# Patient Record
Sex: Female | Born: 1940 | Race: White | Hispanic: No | Smoking: Former smoker
Health system: Southern US, Community
[De-identification: ages and names within clinical notes are randomized; demographics above are authoritative.]

## PROBLEM LIST (undated history)

## (undated) DIAGNOSIS — G912 (Idiopathic) normal pressure hydrocephalus: Secondary | ICD-10-CM

## (undated) DIAGNOSIS — R269 Unspecified abnormalities of gait and mobility: Secondary | ICD-10-CM

## (undated) DIAGNOSIS — I639 Cerebral infarction, unspecified: Secondary | ICD-10-CM

## (undated) DIAGNOSIS — E782 Mixed hyperlipidemia: Secondary | ICD-10-CM

## (undated) DIAGNOSIS — I340 Nonrheumatic mitral (valve) insufficiency: Secondary | ICD-10-CM

## (undated) DIAGNOSIS — I10 Essential (primary) hypertension: Secondary | ICD-10-CM

## (undated) DIAGNOSIS — I471 Supraventricular tachycardia, unspecified: Secondary | ICD-10-CM

## (undated) DIAGNOSIS — R001 Bradycardia, unspecified: Secondary | ICD-10-CM

## (undated) DIAGNOSIS — K589 Irritable bowel syndrome without diarrhea: Secondary | ICD-10-CM

## (undated) DIAGNOSIS — K5790 Diverticulosis of intestine, part unspecified, without perforation or abscess without bleeding: Secondary | ICD-10-CM

## (undated) HISTORY — PX: CATARACT EXTRACTION W/ INTRAOCULAR LENS IMPLANT: SHX1309

## (undated) HISTORY — DX: Nonrheumatic mitral (valve) insufficiency: I34.0

## (undated) HISTORY — DX: Mixed hyperlipidemia: E78.2

## (undated) HISTORY — DX: Irritable bowel syndrome, unspecified: K58.9

## (undated) HISTORY — DX: Supraventricular tachycardia, unspecified: I47.10

## (undated) HISTORY — PX: COLONOSCOPY: SHX174

## (undated) HISTORY — DX: Supraventricular tachycardia: I47.1

## (undated) HISTORY — DX: (Idiopathic) normal pressure hydrocephalus: G91.2

## (undated) HISTORY — PX: EYE SURGERY: SHX253

## (undated) HISTORY — DX: Bradycardia, unspecified: R00.1

## (undated) HISTORY — DX: Unspecified abnormalities of gait and mobility: R26.9

## (undated) HISTORY — DX: Diverticulosis of intestine, part unspecified, without perforation or abscess without bleeding: K57.90

## (undated) HISTORY — DX: Essential (primary) hypertension: I10

---
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
## 1998-12-13 ENCOUNTER — Other Ambulatory Visit: Admission: RE | Admit: 1998-12-13 | Discharge: 1998-12-13 | Payer: Self-pay | Admitting: Obstetrics & Gynecology

## 2000-05-11 ENCOUNTER — Other Ambulatory Visit: Admission: RE | Admit: 2000-05-11 | Discharge: 2000-05-11 | Payer: Self-pay | Admitting: Obstetrics & Gynecology

## 2001-07-02 ENCOUNTER — Other Ambulatory Visit: Admission: RE | Admit: 2001-07-02 | Discharge: 2001-07-02 | Payer: Self-pay | Admitting: Obstetrics & Gynecology

## 2002-09-14 ENCOUNTER — Other Ambulatory Visit: Admission: RE | Admit: 2002-09-14 | Discharge: 2002-09-14 | Payer: Self-pay | Admitting: Obstetrics & Gynecology

## 2003-04-01 ENCOUNTER — Inpatient Hospital Stay (HOSPITAL_COMMUNITY): Admission: EM | Admit: 2003-04-01 | Discharge: 2003-04-04 | Payer: Self-pay | Admitting: Orthopaedic Surgery

## 2003-04-01 HISTORY — PX: ORIF HIP FRACTURE: SHX2125

## 2003-04-04 ENCOUNTER — Inpatient Hospital Stay (HOSPITAL_COMMUNITY)
Admission: RE | Admit: 2003-04-04 | Discharge: 2003-04-12 | Payer: Self-pay | Admitting: Physical Medicine & Rehabilitation

## 2003-11-08 ENCOUNTER — Other Ambulatory Visit: Admission: RE | Admit: 2003-11-08 | Discharge: 2003-11-08 | Payer: Self-pay | Admitting: Obstetrics & Gynecology

## 2004-04-25 ENCOUNTER — Ambulatory Visit: Payer: Self-pay | Admitting: Cardiology

## 2007-04-08 ENCOUNTER — Ambulatory Visit (HOSPITAL_COMMUNITY): Admission: RE | Admit: 2007-04-08 | Discharge: 2007-04-08 | Payer: Self-pay | Admitting: Ophthalmology

## 2007-04-29 ENCOUNTER — Ambulatory Visit (HOSPITAL_COMMUNITY): Admission: RE | Admit: 2007-04-29 | Discharge: 2007-04-29 | Payer: Self-pay | Admitting: Ophthalmology

## 2008-06-01 ENCOUNTER — Encounter (INDEPENDENT_AMBULATORY_CARE_PROVIDER_SITE_OTHER): Payer: Self-pay | Admitting: Obstetrics & Gynecology

## 2008-06-02 ENCOUNTER — Ambulatory Visit (HOSPITAL_COMMUNITY): Admission: RE | Admit: 2008-06-02 | Discharge: 2008-06-04 | Payer: Self-pay | Admitting: Obstetrics & Gynecology

## 2008-06-02 HISTORY — PX: ABDOMINAL HYSTERECTOMY: SHX81

## 2009-02-04 LAB — HM COLONOSCOPY

## 2010-08-15 ENCOUNTER — Encounter: Payer: Self-pay | Admitting: Nurse Practitioner

## 2010-08-15 DIAGNOSIS — K5792 Diverticulitis of intestine, part unspecified, without perforation or abscess without bleeding: Secondary | ICD-10-CM

## 2010-08-15 DIAGNOSIS — H8110 Benign paroxysmal vertigo, unspecified ear: Secondary | ICD-10-CM

## 2010-08-15 DIAGNOSIS — E785 Hyperlipidemia, unspecified: Secondary | ICD-10-CM | POA: Insufficient documentation

## 2010-08-15 DIAGNOSIS — E559 Vitamin D deficiency, unspecified: Secondary | ICD-10-CM | POA: Insufficient documentation

## 2010-08-15 DIAGNOSIS — J309 Allergic rhinitis, unspecified: Secondary | ICD-10-CM | POA: Insufficient documentation

## 2010-09-09 LAB — CBC
HCT: 32 % — ABNORMAL LOW (ref 36.0–46.0)
HCT: 41.5 % (ref 36.0–46.0)
Hemoglobin: 11.1 g/dL — ABNORMAL LOW (ref 12.0–15.0)
Hemoglobin: 14.1 g/dL (ref 12.0–15.0)
MCHC: 34 g/dL (ref 30.0–36.0)
MCHC: 34.6 g/dL (ref 30.0–36.0)
MCV: 93.4 fL (ref 78.0–100.0)
MCV: 93.8 fL (ref 78.0–100.0)
Platelets: 240 10*3/uL (ref 150–400)
Platelets: 306 10*3/uL (ref 150–400)
RBC: 3.41 MIL/uL — ABNORMAL LOW (ref 3.87–5.11)
RBC: 4.44 MIL/uL (ref 3.87–5.11)
RDW: 12.6 % (ref 11.5–15.5)
RDW: 12.7 % (ref 11.5–15.5)
WBC: 10.7 10*3/uL — ABNORMAL HIGH (ref 4.0–10.5)
WBC: 6.6 10*3/uL (ref 4.0–10.5)

## 2010-09-09 LAB — COMPREHENSIVE METABOLIC PANEL
ALT: 22 U/L (ref 0–35)
AST: 22 U/L (ref 0–37)
Albumin: 3.3 g/dL — ABNORMAL LOW (ref 3.5–5.2)
Alkaline Phosphatase: 50 U/L (ref 39–117)
BUN: 11 mg/dL (ref 6–23)
CO2: 25 mEq/L (ref 19–32)
Calcium: 9.4 mg/dL (ref 8.4–10.5)
Chloride: 102 mEq/L (ref 96–112)
Creatinine, Ser: 0.75 mg/dL (ref 0.4–1.2)
GFR calc Af Amer: >60 mL/min (ref >60)
GFR calc non Af Amer: >60 mL/min (ref >60)
Glucose, Bld: 87 mg/dL (ref 70–99)
Potassium: 4.1 mEq/L (ref 3.5–5.1)
Sodium: 135 mEq/L (ref 135–145)
Total Bilirubin: 0.5 mg/dL (ref 0.3–1.2)
Total Protein: 6.1 g/dL (ref 6.0–8.3)

## 2010-09-09 LAB — PROTIME-INR
INR: 0.9 (ref 0.00–1.49)
Prothrombin Time: 12.6 seconds (ref 11.6–15.2)

## 2010-09-09 LAB — APTT: aPTT: 25 seconds (ref 24–37)

## 2010-10-08 NOTE — H&P (Signed)
NAMESHIARA, MCGOUGH NO.:  [id number]   MEDICAL RECORD NO.:  [id number]          PATIENT TYPE:  AMB   LOCATION:  SDC                           FACILITY:  [facility]   PHYSICIAN:  Freddy Finner, M.D.   DATE OF BIRTH:  May 10, 1941   DATE OF ADMISSION:  DATE OF DISCHARGE:                              HISTORY & PHYSICAL   DATE OF ADMISSION:  The date of her admission will be June 02, 2008.   ADMISSION DIAGNOSIS:  Atypical complex endometrial hyperplasia.   HISTORY OF THE PRESENT ILLNESS:  The patient is a 70 year old widowed  white female, gravida 3, para 2 who has been followed in my office since  approximately 1990 units; however, certainly the late 1980s.  She has  had a couple episodes years ago of abnormal Pap with biopsy proven mild  cervical  dysplasia.  Recent Pap smear, however, have been normal.  She  did have an episode of postmenopausal bleeding for which she had  hysteroscopy and D&C in early December 2009, which showed atypical  complex endometrial hyperplasia.  Based on this finding and after  careful consultation with the patient she has elected to proceed with a  laparoscopic-assisted vaginal hysterectomy, bilateral salpingo-  oophorectomy.   REVIEW OF SYSTEMS:  The review of systems is basically negative.  No  cardiopulmonary, GI or GU complaints at the present time.   PAST MEDICAL HISTORY:  The patient has no known significant medical  illnesses.   SOCIAL HISTORY:  The patient was a long time smoker, but quit  approximately 10 years ago.   ALLERGIES:  The patient has no known allergies to medications other than  SULFA.   MEDICATIONS:  The patient's only current medications are:  1. Bentyl, which she takes for bowel discomfort.  2. Premarin 0.625 mg per day.  3. Prometrium 200 mg for 12 days out of an every 90-day cycle.   BLOOD TRANSFUSIONS:  The patient has never had a blood transfusion.   PAST MEDICAL HISTORY:  Previous surgical  procedures include:  1. D&C as noted above.  2. The patient has had biopsies of breast nodules in the past on two      occasions, which were negative.   PHYSICAL EXAMINATION:  HEENT:  Head, eyes, ears, nose, and throat are  grossly within normal limits.  VITAL SIGNS:  Blood pressure in the office, 102/60.  NECK:  Thyroid gland is not palpably enlarged.  CHEST:  The chest is clear to auscultation.  HEART:  The heart has a normal sinus rhythm without murmurs, rubs or  gallops.  BREASTS:  The breast exam is considered to be normal.  There are no  palpable masses.  No nipple discharge or skin change.  ABDOMEN:  The abdomen is soft and nontender without appreciable  organomegaly or palpable masses.  PELVIC:  The pelvic examination reveals there are mild atrophic changes,  but the vulva, vagina and cervix are normal.  Bimanual reveals the  uterus to be slightly enlarged.  There are no palpable adnexal masses.  RECTAL:  The rectum is palpably  normal and rectovaginal exam confirms  the above findings.  EXTREMITIES:  The extremities are without cyanosis, clubbing or edema.   ASSESSMENT:  1. Biopsy proven complex endometrial hyperplasia.  2. History of cervical dysplasia.   PLAN:  1. Laparoscopically-assisted vaginal hysterectomy with bilateral      salpingo-oophorectomy.  The patient has reviewed the [facility] brochure      describing the operative procedure including technique, potential      complications including infection, hemorrhage and injury to other      organs, and the risk of deep vein thrombosis.  2. The patient has also be counseled that she will be given IV      antibiotics and compression hose for the lower extremities to      reduce the risk of complications.   The patient is admitted now and prepared to proceed with surgery.      Freddy Finner, M.D.  Electronically Signed     WRN/MEDQ  D:  06/01/2008  T:  06/02/2008  Job:  [id number]

## 2010-10-08 NOTE — Op Note (Signed)
Brandi [name], Brandi [name] NO.:  [id number]   MEDICAL RECORD NO.:  [id number]          PATIENT TYPE:  OIB   LOCATION:  9303                          [facility]ACILITY:  [facility]   PHYSICIAN:  Freddy Finner, M.D.   DATE OF BIRTH:  May 08, 1941   DATE OF PROCEDURE:  06/02/2008  DATE OF DISCHARGE:                               OPERATIVE REPORT   PREOPERATIVE DIAGNOSIS:  1. Complex atypical endometrial hyperplasia.  2. Uterine fibroid.   POSTOPERATIVE DIAGNOSIS:  1. Complex atypical endometrial hyperplasia.  2. Uterine fibroid.   OPERATIVE PROCEDURE:  Laparoscopically-assisted vaginal hysterectomy,  bilateral salpingo-oophorectomy.   SURGEON:  Freddy Finner, M.D.   ASSISTANT:  Stann Mainland. Grewal, M.D.   ESTIMATED INTRAOPERATIVE BLOOD LOSS:  300 mL.   ANESTHESIA:  General endotracheal.   INTRAOPERATIVE COMPLICATIONS:  None.   PRESENT ILLNESS:  Details of present illness are recorded in the  admission note.   DESCRIPTION OF PROCEDURE:  The patient was admitted on the morning of  surgery, given a bolus of Ancef 1 gram IV and placed in PAS hose,  brought to the operating room and there placed under adequate general  endotracheal anesthesia.  Abdomen, perineum and vagina were prepped in  the usual fashion with Betadine scrub followed by Betadine solution.  Bladder was evacuated using sterile technique.  Hulka tenaculum was  attached to the cervix under direct visualization.  Sterile drapes were  applied.   Two small incisions were made, one at the umbilicus, one just above the  symphysis.  The anterior abdominal wall was elevated manually and an 11-  mm bladed disposable trocar was introduced in the umbilicus without  difficulty.  Inspection revealed adequate placement with no evidence of  injury on entry.  Pneumoperitoneum was allowed to accumulate with carbon  dioxide gas.  A 5 mm trocar was placed through a lower incision just  above the symphysis.  Through it  spring-loaded grasping forceps and  later the Najat irrigation system were used.   Scanning inspection of the upper abdomen revealed no apparent  abnormalities.  The appendix was not visualized and is presumed  surgically absent.  The uterus itself was enlarged.  The tubes and  ovaries were normal.  There were no peritoneal lesions in the pelvis.  Using the EnSeal device, the infundibulopelvic ligament on the right was  progressively sealed and divided.  The round ligament was sealed and  divided.  The dissection was continued down the broad ligament to a  level just above the uterine arteries.  The left side was then treated  identically.   Attention was then turned vaginally.  Posterior weighted vaginal  retractor was placed. Jacobs tenaculum was applied to replace the Hulka  tenaculum.  Colpotomy incision was made while tenting the mucosa  posterior to the cervix and the posterior peritoneum entered.  The  banana retractor was then placed.  A scalpel was used to release the  mucosa from the cervix and the bladder advanced off the cervix.  The  LigaSure device was then used to seal and divide the uterosacrals.  This  was done in steps because of the marked lack of descent and the  enlargement of the uterus.  The bladder pillars were sealed and divided  with LigaSure.  Bladder was further advanced off the cervix and lower  uterine segment.  Cardinal ligament pedicles were sealed and divided  using LigaSure as were the uterine artery pedicles.  Anterior peritoneum  was entered.  An additional pedicle was taken above the vessels on each  side with LigaSure.  The uterus was enlarged to the point of marked  difficulty delivering it and for that reason it was cored, which allowed  delivery of the uterus through the vaginal introitus.  Remaining  peritoneal pedicles were sealed and divided with LigaSure.  The angles  of the vagina were anchored to uterosacrals with mattress sutures of 0   Monocryl.  The uterosacrals were plicated and posterior peritoneum  closed with interrupted 0 Monocryl.  Cuff was closed vertically with  figure-of-eights of 0 Monocryl.  Foley catheter was placed.   The patient had been given methylene blue or indigo carmine IV to  identify bladder injury and none was apparent.  Reinspection  laparoscopically was carried out using the Najat system for irrigation  and using EnSeal to seal small bleeding sources which were identified.  After completing the hemostasis the irrigating solution was aspirated  from the abdomen.  Gas was allowed to escape.  Instruments were removed.  Skin incisions were closed with interrupted subcuticular sutures of 3-0  Dexon.  0.25% Marcaine was injected for postoperative analgesia in each  incision.  Steri-Strips were applied to the lower incision.  The  procedure was terminated.  The patient was awakened and taken to  recovery room in good condition.      Freddy Finner, M.D.  Electronically Signed     WRN/MEDQ  D:  06/02/2008  T:  06/02/2008  Job:  [id number]

## 2010-10-11 NOTE — Op Note (Signed)
Brandi [name], Brandi [name]                          ACCOUNT NO.:  [id number]   MEDICAL RECORD NO.:  [id number]                   PATIENT TYPE:  INP   LOCATION:  5010                                 FACILITY:  [facility]   PHYSICIAN:  Sharolyn Douglas, M.D.                     DATE OF BIRTH:  Sep 12, 1940   DATE OF PROCEDURE:  04/01/2003  DATE OF DISCHARGE:                                 OPERATIVE REPORT   PREOPERATIVE DIAGNOSIS:  Right femoral neck fracture   POSTOPERATIVE DIAGNOSIS:  Right femoral neck fracture.   PROCEDURE:  Open reduction and internal fixation right femoral neck  fracture with 3 Synthes 7.3-mm cannulated screws.   SURGEON:  Sharolyn Douglas, M.D.   ASSISTANT:  Verlin Fester, P.A.   ANESTHESIA:  General endotracheal anesthesia.   COMPLICATIONS:  None.   INDICATIONS FOR PROCEDURE:  The patient is a 70 year old female  who was  vacuuming this morning. She slipped and landed directly on the right hip.  She was unable to ambulate. She was taken to [facility]. X-rays  revealed  a femoral neck  fracture. She requested transfer to [facility]. I accepted her transfer and she was admitted directly to the  orthopedic floor. She has no previous  history of fractures, no previous hip  complaints. No fevers or chills, sweats or unexplained weight loss.   PAST MEDICAL HISTORY:  Noncontributory.   MEDICATIONS:  None.   ALLERGIES:  None.   PHYSICAL EXAMINATION:  She is resting comfortably in bed. Her range of  motion of the right hip is painful. She has good pulses distally. She is  able to wiggle her toes. She had a small  bruise about her left wrist. There  was no crepitance.   LABORATORY DATA:  X-rays showed a valgus impacted femoral neck  fracture  with minimal displacement.   PLAN:  I reviewed the situation in detail with the patient and the family. I  discussed  treatment options including  hemiarthroplasty versus pinning.  Because of her relatively young age, we  elected to proceed with pinning. She  understands that the fracture may go on to displace or she may develop post  traumatic arthritis requiring arthroplasty. The risks and benefits were  extensively discussed  with the patient and her family  and she elected to  proceed.   DESCRIPTION OF PROCEDURE:  The patient was properly identified  in the  holding area and taken to the operating room. She underwent  general  endotracheal anesthesia without difficulty. She was carefully  positioned on  the fracture table. The right leg was placed in the traction apparatus. The  leg was gently internally rotated so the patella faced forward. The left leg  was placed in the well leg holder. We brought in fluoroscopy and confirmed  that there had been no significant displacement of the fracture. We then  prepped and draped the leg in the usual sterile fashion using a shower  curtain.   A 4-cm incision was made in the lateral aspect of the thigh just below the  vastus ridge. We dissected through the deep fascia and vastus lateralis. A  guide wire was then placed into the femoral head and the posterior inferior  quadrant. We then used a Gatlin gun device to place 2 more parallel guide  wires.   We then placed the 7.3-mm cannulated screws over the guide wires. We used  two 100-mm screws and a 95-mm screw. We confirmed using biplanar fluoroscopy  that we had not penetrated the joint. We had good purchase of the screws.   The guide wires were removed. The wound was irrigated. Final AP and lateral  images were saved. The deep fascia was closed with an 0 Vicryl and the  subcutaneous layer was closed with 2-0 Vicryl followed  by a running 3-0  subcuticular Vicryl in the skin. Benzoin and Steri-Strips were placed. A  sterile dressing was applied.   The patient was extubated without difficulty and transferred to the recovery  room in stable condition. She was able to move her upper  and lower   extremities.                                               Sharolyn Douglas, M.D.    MC/MEDQ  D:  04/01/2003  T:  04/01/2003  Job:  [id number]

## 2010-10-11 NOTE — Discharge Summary (Signed)
Brandi [name], Brandi [name]                          ACCOUNT NO.:  [id number]   MEDICAL RECORD NO.:  [id number]                   PATIENT TYPE:  INP   LOCATION:  5010                                 FACILITY:  [facility]   PHYSICIAN:  Verlin Fester, P.A.                 DATE OF BIRTH:  February 26, 1941   DATE OF ADMISSION:  04/01/2003  DATE OF DISCHARGE:  04/04/2003                                 DISCHARGE SUMMARY   DISCHARGED TO:  [facility].   ADMITTING DIAGNOSES:  1. Right hip fracture.  2. Post menopausal.   DISCHARGE DIAGNOSES:  1. Status post right hip pinning.  2. Post menopausal.  3. On Coumadin treatment x4 weeks.   CONSULTATIONS:  [facility].   PROCEDURE:  Right hip pining of a femoral neck fracture.  This was done by  Dr. Sharolyn Douglas and assisted by Verlin Fester, P.A.-C.   BRIEF HISTORY:  The patient is a 70 year old female who was at her home  prior to admission, vacuuming, with a fall after getting her feet tangled  up.  She states her feet came out from underneath her.  She __________  .  She had right hip pain and unable to bear weight.  She was transported to  the [facility] and on to [facility] for evaluation and  treatment.  Dr. Noel Gerold was consulted for orthopedic management after she was  found to have a right femoral neck fracture.  Best course of management for  this was surgical intervention with right hip pinning.  The risks and  benefits of this procedure were discussed with the patient by Dr. Noel Gerold.  It  was felt that she achieved understanding and wished to proceed.   LABORATORY DATA:  CBC was within normal limits with the exception of a white  count of 11.2 postoperatively.  CBC was monitored for three days.  She did  remain stable.  Hemoglobin dropped slightly to a low of 12.1 and 34.5  respectively.  She was asymptomatic.  __________  PT and PTT preoperatively.  Postoperatively, on Coumadin, it gradually  decreased.  PTT __________  1.4  prior to being discharged to rehabilitation.  BMET postoperatively was  within normal limits, with the exception of glucose 123, BUN 3.  Calcium was  7.9.  UA preoperatively was negative with the exception of __________  .   X-rays postoperatively showed pin placed across the right hip fracture in  satisfactory position.  Preoperative chest x-ray showed no active  cardiopulmonary problem or disease.  __________  EKG.   HOSPITAL COURSE:  On April 01, 2003, the patient was admitted to the  hospital and __________  taken later that day.  __________  without  intraoperative complications.  Routine orthopedic hip protocol was followed.  She progressed well throughout the hospital stay.  She did not develop any  postoperative complications.  On postoperative day  2, her dressing was  removed.  __________  .  Postoperatively, the patient was started on  Coumadin and this was monitored and dosed per the pharmacy while she was in  the hospital.  Her PT and INR gradually increased prior to discharge to  rehabilitation.  The patient received physical therapy while she was in the  hospital.  She did progress __________  secondary to not having any help at  home.  It was felt that her best course would be a short stay in inpatient  rehabilitation.  Secondary to this, rehabilitation doctors were consulted.  It was felt she was deemed an appropriate candidate.  This was arranged by  discharge planning.   By April 04, 2003, the patient was stable and ready for discharge to  rehabilitation medically, and they did get a bed available.  Therefore, she  was transferred over.   DISCHARGE INSTRUCTIONS:  1. __________  weight bearing.  __________  2. Followup in two weeks after discharge from rehabilitation with Dr. Noel Gerold.     Call for an appointment.   DISCHARGE MEDICATIONS:  1. Coumadin.  2. Percocet p.r.n.  3. Phenergan p.r.n.   DISCHARGE DIET:  A regular  diet.   CONDITION ON DISCHARGE:  Stable and improved.   DISPOSITION:  The patient is being discharged from [facility] to  [facility].                                                Verlin Fester, P.A.    CM/MEDQ  D:  05/03/2003  T:  05/03/2003  Job:  [id number]

## 2010-10-11 NOTE — Discharge Summary (Signed)
NAMETENEIL, SHILLER NO.:  [id number]   MEDICAL RECORD NO.:  [id number]          PATIENT TYPE:  OIB   LOCATION:  9303                          FACILITY[facility]  [facility]   PHYSICIAN:  Freddy Finner, M.D.   DATE OF BIRTH:  [birth year]   DATE OF ADMISSION:  06/02/2008  DATE OF DISCHARGE:  06/04/2008                               DISCHARGE SUMMARY   DISCHARGE DIAGNOSES:  Atypical complex hyperplasia of the endometrium,  uterine adenomyosis, uterine leiomyomas, no evidence of malignancy.   OPERATIVE PROCEDURE:  Laparoscopic-assisted vaginal hysterectomy,  bilateral salpingo-oophorectomy.   INTRAOPERATIVE AND POSTOPERATIVE COMPLICATIONS:  None.   DISPOSITION:  The patient was in satisfactory improved condition.  At  the time of her discharge on the second postoperative day, she was  discharged home to resume all of her regular medications except to  discontinue Prometrium.  She is to take a regular diet.  She is given  Percocet 5/325 to be taken for postoperative pain.  She is to have  progressively increasing activity, but to avoid vaginal entry or heavy  lifting.  Her condition is considered to be good.   Details of present illness, past history, family history, review of  systems, and physical exam recorded in the admission note.  The most  remarkable thing about her exam was a recent histologic diagnosis of  right typical complex endometrial hyperplasia.  Her physical exam was  otherwise considered to be normal with perhaps slight enlargement of the  uterus on bimanual exam.   Laboratory data during this admission included a normal EKG, normal CBC,  normal prothrombin time, and PTT, normal Chem-6 panel.  Postoperative  hemoglobin of 11.1.   HOSPITAL COURSE:  The patient was admitted on the morning of surgery.  She was placed in PAS hose.  She was given a bolus of Ancef.  Preoperatively,  she was brought to the operating room in standard  general anesthesia and the  above-described procedure performed without  any intraoperative complications.  Her postoperative course was entirely  satisfactory.  She was a little slow to have adequate return of bowel  function perhaps due to age.  Throughout her postoperative stay,  however, her vital signs were stable.  She was afebrile by the morning  of the second postoperative day.  The incisions were clean, dry, and  intact.  She was having adequate bowel function at that point and  tolerating regular diet.  She was discharged home with further  disposition as noted above.      Freddy Finner, M.D.  Electronically Signed     WRN/MEDQ  D:  07/01/2008  T:  07/01/2008  Job:  [id number]

## 2010-10-11 NOTE — Discharge Summary (Signed)
Brandi [name], Brandi [name]                          ACCOUNT NO.:  [id number]   MEDICAL RECORD NO.:  [id number]                   PATIENT TYPE:  IPS   LOCATION:  4147                                 FACILITY:  [facility]   PHYSICIAN:  Brandi [name], M.D.             DATE OF BIRTH:  Jun 20, 1940   DATE OF ADMISSION:  04/04/2003  DATE OF DISCHARGE:  04/12/2003                                 DISCHARGE SUMMARY   DISCHARGE DIAGNOSES:  1. Right femoral neck fracture status post open reduction and internal     fixation, April 01, 2003.  2. Pain management.  3. Coumadin for deep venous thrombosis prophylaxis.   HISTORY OF PRESENT ILLNESS:  70 year old female admitted April 01, 2003  after a fall while vacuuming the floor, sustaining a right femoral neck  fracture.  She underwent open reduction and internal fixation on April 01, 2003 by Dr. Noel [name].  Placed on Coumadin for deep venous thrombosis  prophylaxis and touch down weight bearing restrictions.   Hospital course was unremarkable.  No chest pain, no nausea or vomiting.  Moderate assist for her transfers.  Latest chemistries with hemoglobin 12.1.  Admitted for a comprehensive rehabilitation program.   PAST MEDICAL HISTORY:  Negative.   ALLERGIES:  SULFA.   PRIMARY CARE PHYSICIAN:  [facility].   MEDICATIONS PRIOR TO ADMISSION:  Premarin.   SOCIAL HISTORY:  No alcohol or tobacco.  She lives alone in [place],  independent prior to admission and retired.  One level home with step to  entry.  Son can assist on discharge.   HOSPITAL COURSE:  The patient did well on rehabilitation services with  therapies initiated on a b.i.d. basis.  The following issues were followed  during patient's rehab course.  Pertaining to [name] right femoral  neck fracture - surgical site is healing nicely with open reduction and  internal fixation April 01, 2003 per Dr. Noel [name].  She will continue to be  touch down weight bearing at  the precautions of orthopedic services.  Pain  management ongoing with the use of Oxycodone p.r.n. and good results.  She  will continue on Coumadin for deep venous thrombosis prophylaxis with latest  INR 2.1, to be completed by [facility] agency.  There were no bleeding  episodes during her rehab course, no bowel or bladder disturbances.  She was  independent in her room with her functional status needing only very little  assistance for lower body dressing.  Home Health therapies would be  arranged.   LATEST LABS:  Showed an INR of 2.1.  Hemoglobin 12.9, hematocrit 36.9.  Sodium 137, potassium 3.5, BUN 6, creatinine 0.7.   DISCHARGE MEDICATIONS:  1. Coumadin, latest dose of 6 mg to be completed on May 01, 2003.  2. Premarin 0.625 mg daily.  3. Oxycodone p.r.n. pain.   ACTIVITY:  Non-weight bearing right lower extremity per orthopedic services.  Home Health physical and occupational therapy.  Home Health nurse for  prothrombin time on Friday, April 14, 2003 per Home Health agency.  FOLLOW UP  Dr. Noel [name] of orthopedic services, call for appointment.      Brandi [name], P.A.                     Brandi [name], M.D.    DA/MEDQ  D:  04/11/2003  T:  04/11/2003  Job:  [id number]   cc:   Brandi [name], M.D.  [address]  [place]  [place] [address]  Fax: [phone]   [facility]

## 2010-10-11 NOTE — H&P (Signed)
Brandi [name], Brandi [name]                          ACCOUNT NO.:  [id number]   MEDICAL RECORD NO.:  [id number]                   [name] TYPE:  INP   LOCATION:  5010                                 FACILITY:  [facility]   PHYSICIAN:  Sharolyn Douglas, M.D.                     DATE OF BIRTH:  1940-06-25   DATE OF ADMISSION:  04/01/2003  DATE OF DISCHARGE:                                HISTORY & PHYSICAL   CHIEF COMPLAINT:  Right hip pain.   HISTORY OF PRESENT ILLNESS:  Brandi [name] is a 70 year old female who was at  home earlier today vacuuming and took a very hard fall.  Brandi [name] got her feet  tangled up.  Brandi [name] states her feet came out from underneath and Brandi [name] landed  very hard on her right hip.  Brandi [name] had Brandi immediate onset of right hip pain  and inability to bear weight.  Brandi [name] was transported to [facility] and  then transferred here to [facility] for evaluation and treatment.  Dr. Noel Gerold was  consulted for orthopedic management.  Brandi [name] was found to have a right hip  femoral neck fracture, which was impacted.  Brandi best course of management  was deemed to be a right hip pinning.  Brandi risks and benefits of that  procedure were discussed with Brandi [name] by Dr. Noel Gerold.  Brandi [name] indicated  understanding and opted to proceed.   ALLERGIES:  SULFA causes nausea and vomiting.   MEDICATIONS:  1. Premarin 0.625 mg daily.  2. Prometryne 100 mg p.o. daily.   PAST MEDICAL HISTORY:  Healthy.   PAST SURGICAL HISTORY:  None.   SOCIAL HISTORY:  Brandi [name] denies alcohol use.  Denies tobacco use.  Brandi [name]  lives alone.  Brandi [name] was very independent prior to being admitted; a  very active 70 year old lady.   FAMILY HISTORY:  Family medical history of noncontributory.   REVIEW OF SYSTEMS:  CONSTITUTIONAL:  Brandi [name] denies fevers, chills,  sweats or bleeding tendencies.  NEUROLOGIC:  Brandi [name] denies blurry vision.  No  delusions, seizures,  headaches or paralysis.  CARDIOVASCULAR:  Denies chest  pain, angina, claudication  or palpitations.  PULMONARY:  Denies shortness of  breath, productive cough or hemoptysis.  GASTROINTESTINAL:  Denies nausea,  vomiting, constipation, diarrhea, melena, or bloody stools.  GENITOURINARY:  Denies dysuria, hematuria or discharge.  MUSCULOSKELETAL:  As per HPI.   PHYSICAL EXAMINATION:  VITAL SIGNS:  Blood pressure 122/68, respirations are  16 and unlabored, pulse is 80 and regular and temperature 97.6.  GENERAL APPEARANCE:  Brandi [name] is a 70 year old white female who is alert  and oriented, and in no acute distress.  Brandi [name] is well-nourished and well-  groomed.  Brandi [name] appears stated age.  Brandi [name] is pleasant and cooperative to exam.  HEENT:  Head is normocephalic and atraumatic.  Pupils equal, round and  react.  Extraocular movements are intact.  Ears patent.  Pharynx clear.  NECK:  Neck is soft palpation.  No lymphadenopathy, thyromegaly or bruits  appreciated.  CHEST:  Chest is clear to auscultation bilaterally.  No rales, rhonchi,  wheezes or friction rubs.  HEART:  S1 and S2.  Regular rate and rhythm.  No murmurs, gallops or rubs  noted.  GENITOURINARY:  Not prudent and not performed.  GASTROINTESTINAL:  Abdomen is soft to palpation.  Nontender and  nondistended.  No organomegaly noted.  Positive bowel sounds throughout.  NEUROLOGIC EXAMINATION:  Motor function and sensation are grossly intact.  EXTREMITIES:  Pulses are intact and symmetric bilaterally.  SKIN:  Skin is intact without any lesions or rashes.   LABORATORY DATA:  X-ray shows a right hip femoral neck impacted fracture.   ASSESSMENT:  Right hip fracture.   PLAN:  Brandi [name] is going to be taken to Brandi operating room for a right  hip pinning.  This is going to be done by Dr. Sharolyn Douglas.      Verlin Fester, P.A.                       Sharolyn Douglas, M.D.    CM/MEDQ  D:  04/01/2003  T:  04/01/2003  Job:  [id number]

## 2011-02-03 LAB — HM COLONOSCOPY

## 2011-03-04 LAB — BASIC METABOLIC PANEL
BUN: 10
CO2: 26
Calcium: 9.3
Chloride: 105
Creatinine, Ser: 0.78
GFR calc Af Amer: >60
GFR calc non Af Amer: >60
Glucose, Bld: 109 — ABNORMAL HIGH
Potassium: 4.2
Sodium: 138

## 2011-03-04 LAB — HEMOGLOBIN AND HEMATOCRIT, BLOOD
HCT: 40.4
Hemoglobin: 13.6

## 2011-06-09 DIAGNOSIS — Z130 Encounter for screening for diseases of the blood and blood-forming organs and certain disorders involving the immune mechanism: Secondary | ICD-10-CM | POA: Diagnosis not present

## 2011-06-09 DIAGNOSIS — Z1231 Encounter for screening mammogram for malignant neoplasm of breast: Secondary | ICD-10-CM | POA: Diagnosis not present

## 2011-06-09 DIAGNOSIS — Z124 Encounter for screening for malignant neoplasm of cervix: Secondary | ICD-10-CM | POA: Diagnosis not present

## 2011-06-17 DIAGNOSIS — E785 Hyperlipidemia, unspecified: Secondary | ICD-10-CM | POA: Diagnosis not present

## 2011-06-26 DIAGNOSIS — R030 Elevated blood-pressure reading, without diagnosis of hypertension: Secondary | ICD-10-CM | POA: Diagnosis not present

## 2011-06-26 DIAGNOSIS — E559 Vitamin D deficiency, unspecified: Secondary | ICD-10-CM | POA: Diagnosis not present

## 2011-06-26 DIAGNOSIS — E785 Hyperlipidemia, unspecified: Secondary | ICD-10-CM | POA: Diagnosis not present

## 2011-07-07 DIAGNOSIS — I10 Essential (primary) hypertension: Secondary | ICD-10-CM | POA: Diagnosis not present

## 2011-07-07 DIAGNOSIS — E782 Mixed hyperlipidemia: Secondary | ICD-10-CM | POA: Diagnosis not present

## 2011-09-16 DIAGNOSIS — R5383 Other fatigue: Secondary | ICD-10-CM | POA: Diagnosis not present

## 2011-09-16 DIAGNOSIS — E785 Hyperlipidemia, unspecified: Secondary | ICD-10-CM | POA: Diagnosis not present

## 2011-09-16 DIAGNOSIS — IMO0001 Reserved for inherently not codable concepts without codable children: Secondary | ICD-10-CM | POA: Diagnosis not present

## 2011-09-16 DIAGNOSIS — E559 Vitamin D deficiency, unspecified: Secondary | ICD-10-CM | POA: Diagnosis not present

## 2011-09-16 DIAGNOSIS — R5381 Other malaise: Secondary | ICD-10-CM | POA: Diagnosis not present

## 2011-10-22 DIAGNOSIS — I10 Essential (primary) hypertension: Secondary | ICD-10-CM | POA: Diagnosis not present

## 2011-10-22 DIAGNOSIS — E782 Mixed hyperlipidemia: Secondary | ICD-10-CM | POA: Diagnosis not present

## 2011-12-15 DIAGNOSIS — E785 Hyperlipidemia, unspecified: Secondary | ICD-10-CM | POA: Diagnosis not present

## 2011-12-15 DIAGNOSIS — E559 Vitamin D deficiency, unspecified: Secondary | ICD-10-CM | POA: Diagnosis not present

## 2011-12-15 DIAGNOSIS — I10 Essential (primary) hypertension: Secondary | ICD-10-CM | POA: Diagnosis not present

## 2012-01-22 DIAGNOSIS — I6789 Other cerebrovascular disease: Secondary | ICD-10-CM | POA: Diagnosis not present

## 2012-01-22 DIAGNOSIS — I471 Supraventricular tachycardia: Secondary | ICD-10-CM | POA: Diagnosis not present

## 2012-01-22 DIAGNOSIS — I5041 Acute combined systolic (congestive) and diastolic (congestive) heart failure: Secondary | ICD-10-CM | POA: Diagnosis not present

## 2012-02-25 DIAGNOSIS — Z23 Encounter for immunization: Secondary | ICD-10-CM | POA: Diagnosis not present

## 2012-02-25 DIAGNOSIS — M25529 Pain in unspecified elbow: Secondary | ICD-10-CM | POA: Diagnosis not present

## 2012-04-08 DIAGNOSIS — E559 Vitamin D deficiency, unspecified: Secondary | ICD-10-CM | POA: Diagnosis not present

## 2012-04-08 DIAGNOSIS — E785 Hyperlipidemia, unspecified: Secondary | ICD-10-CM | POA: Diagnosis not present

## 2012-04-08 DIAGNOSIS — I10 Essential (primary) hypertension: Secondary | ICD-10-CM | POA: Diagnosis not present

## 2012-04-13 DIAGNOSIS — M25529 Pain in unspecified elbow: Secondary | ICD-10-CM | POA: Diagnosis not present

## 2012-04-13 DIAGNOSIS — E785 Hyperlipidemia, unspecified: Secondary | ICD-10-CM | POA: Diagnosis not present

## 2012-04-13 DIAGNOSIS — Z23 Encounter for immunization: Secondary | ICD-10-CM | POA: Diagnosis not present

## 2012-05-10 DIAGNOSIS — I10 Essential (primary) hypertension: Secondary | ICD-10-CM | POA: Diagnosis not present

## 2012-06-14 DIAGNOSIS — M810 Age-related osteoporosis without current pathological fracture: Secondary | ICD-10-CM | POA: Diagnosis not present

## 2012-06-14 DIAGNOSIS — Z01419 Encounter for gynecological examination (general) (routine) without abnormal findings: Secondary | ICD-10-CM | POA: Diagnosis not present

## 2012-06-14 DIAGNOSIS — Z1231 Encounter for screening mammogram for malignant neoplasm of breast: Secondary | ICD-10-CM | POA: Diagnosis not present

## 2012-06-14 DIAGNOSIS — Z130 Encounter for screening for diseases of the blood and blood-forming organs and certain disorders involving the immune mechanism: Secondary | ICD-10-CM | POA: Diagnosis not present

## 2012-06-14 DIAGNOSIS — Z13820 Encounter for screening for osteoporosis: Secondary | ICD-10-CM | POA: Diagnosis not present

## 2012-07-12 DIAGNOSIS — H524 Presbyopia: Secondary | ICD-10-CM | POA: Diagnosis not present

## 2012-07-12 DIAGNOSIS — Z961 Presence of intraocular lens: Secondary | ICD-10-CM | POA: Diagnosis not present

## 2012-07-12 DIAGNOSIS — H52229 Regular astigmatism, unspecified eye: Secondary | ICD-10-CM | POA: Diagnosis not present

## 2012-07-19 DIAGNOSIS — J209 Acute bronchitis, unspecified: Secondary | ICD-10-CM | POA: Diagnosis not present

## 2012-08-03 ENCOUNTER — Encounter: Payer: Self-pay | Admitting: Family Medicine

## 2012-08-12 ENCOUNTER — Ambulatory Visit: Payer: Self-pay | Admitting: Family Medicine

## 2012-08-23 DIAGNOSIS — I10 Essential (primary) hypertension: Secondary | ICD-10-CM | POA: Diagnosis not present

## 2012-08-24 ENCOUNTER — Encounter: Payer: Self-pay | Admitting: Family Medicine

## 2012-08-24 ENCOUNTER — Ambulatory Visit (INDEPENDENT_AMBULATORY_CARE_PROVIDER_SITE_OTHER): Payer: Medicare Other | Admitting: Family Medicine

## 2012-08-24 VITALS — BP 136/67 | HR 58 | Temp 97.0°F | Ht 64.0 in | Wt 163.8 lb

## 2012-08-24 DIAGNOSIS — H8110 Benign paroxysmal vertigo, unspecified ear: Secondary | ICD-10-CM | POA: Diagnosis not present

## 2012-08-24 DIAGNOSIS — D492 Neoplasm of unspecified behavior of bone, soft tissue, and skin: Secondary | ICD-10-CM | POA: Insufficient documentation

## 2012-08-24 DIAGNOSIS — E559 Vitamin D deficiency, unspecified: Secondary | ICD-10-CM

## 2012-08-24 DIAGNOSIS — E785 Hyperlipidemia, unspecified: Secondary | ICD-10-CM | POA: Diagnosis not present

## 2012-08-24 LAB — HEPATIC FUNCTION PANEL
ALT: 19 U/L (ref 0–35)
AST: 20 U/L (ref 0–37)
Albumin: 3.8 g/dL (ref 3.5–5.2)
Alkaline Phosphatase: 62 U/L (ref 39–117)
Bilirubin, Direct: 0.1 mg/dL (ref 0.0–0.3)
Indirect Bilirubin: 0.2 mg/dL (ref 0.0–0.9)
Total Bilirubin: 0.3 mg/dL (ref 0.3–1.2)
Total Protein: 6.1 g/dL (ref 6.0–8.3)

## 2012-08-24 MED ORDER — MECLIZINE HCL 12.5 MG PO TABS: 12.5000 mg | ORAL_TABLET | Freq: Three times a day (TID) | ORAL | Status: DC | PRN

## 2012-08-24 NOTE — Progress Notes (Signed)
Subjective:     Patient ID: Brandi [name], female   DOB: [birth year], 72 y.o.   MRN: [id number]  HPI Patient comes in for routine followup for hyperlipidemia and vitamin D deficiency. A new problem is a 7 mm reddish brown spot on her distal leg and has been present for many months. And wondered if she is in need of seeing a dermatologist Dr. Jorja Loa. She tries to be healthy by eating a healthy diet. With the aging she has noticed that she has an increase in vertigo. This is controlled with the meclizine.    PMH/PSH: reviewed/updated in Epic  SH/FH: reviewed/updated in Epic  Allergies: reviewed/updated in Epic  Medications: reviewed/updated in Epic  Immunizations: reviewed/updated in Epic    Review of Systems    no other symptoms Objective:   Physical Exam     Assessment:       Neoplasm of skin of lower leg - Plan: Ambulatory referral to Dermatology  HLD (hyperlipidemia) - Plan: BASIC METABOLIC PANEL WITH GFR, NMR Lipoprofile with Lipids, Hepatic function panel  Unspecified vitamin D deficiency - Plan: Vitamin D 25 hydroxy  Benign paroxysmal positional vertigo - Plan: BASIC METABOLIC PANEL WITH GFR  .  Plan:     Orders Placed This Encounter  Procedures  . BASIC METABOLIC PANEL WITH GFR  . NMR Lipoprofile with Lipids  . Hepatic function panel  . Vitamin D 25 hydroxy  . Ambulatory referral to Dermatology    Referral Priority:  Routine    Referral Type:  Consultation    Referral Reason:  Specialty Services Required    Requested Specialty:  Dermatology    Number of Visits Requested:  1   No results found for this or any previous visit (from the past 24 hour(s)).                     Meds ordered this encounter  Medications  . ezetimibe (ZETIA) 10 MG tablet    Sig: Take 10 mg by mouth 3 (three) times a week.  . meclizine (ANTIVERT) 12.5 MG tablet    Sig: Take 1 tablet (12.5 mg total) by mouth 3 (three) times daily as needed.    Dispense:  90 tablet    Refill:   0   diet and exercise discussed.  Yohana Bartha P. Modesto Charon, M.D.

## 2012-08-25 LAB — NMR LIPOPROFILE WITH LIPIDS
Cholesterol, Total: 154 mg/dL (ref <200)
HDL Particle Number: 54.6 umol/L (ref >=30.5)
HDL Size: 9.2 nm (ref >=9.2)
HDL-C: 68 mg/dL (ref >=40)
LDL (calc): 60 mg/dL (ref <100)
LDL Particle Number: 1065 nmol/L — ABNORMAL HIGH (ref <1000)
LDL Size: 20.5 nm — ABNORMAL LOW (ref >20.5)
LP-IR Score: 44 (ref <=45)
Large HDL-P: 11.4 umol/L (ref >=4.8)
Large VLDL-P: 3.4 nmol/L — ABNORMAL HIGH (ref <=2.7)
Small LDL Particle Number: 720 nmol/L — ABNORMAL HIGH (ref <=527)
Triglycerides: 131 mg/dL (ref <150)
VLDL Size: 49.1 nm — ABNORMAL HIGH (ref >=46.6)

## 2012-08-25 LAB — VITAMIN D 25 HYDROXY (VIT D DEFICIENCY, FRACTURES): Vit D, 25-Hydroxy: 44 ng/mL (ref 30–89)

## 2012-08-25 LAB — BASIC METABOLIC PANEL WITH GFR
BUN: 10 mg/dL (ref 6–23)
CO2: 22 mEq/L (ref 19–32)
Calcium: 9.5 mg/dL (ref 8.4–10.5)
Chloride: 105 mEq/L (ref 96–112)
Creat: 0.82 mg/dL (ref 0.50–1.10)
GFR, Est African American: 82 mL/min
GFR, Est Non African American: 71 mL/min
Glucose, Bld: 82 mg/dL (ref 70–99)
Potassium: 4.6 mEq/L (ref 3.5–5.3)
Sodium: 138 mEq/L (ref 135–145)

## 2012-08-25 NOTE — Progress Notes (Signed)
Quick Note:  Lab result at goal. No change in Medications for now. ______ 

## 2012-08-26 ENCOUNTER — Encounter: Payer: Self-pay | Admitting: *Deleted

## 2012-10-04 DIAGNOSIS — I471 Supraventricular tachycardia: Secondary | ICD-10-CM | POA: Diagnosis not present

## 2012-10-05 ENCOUNTER — Encounter: Payer: Self-pay | Admitting: Cardiology

## 2012-10-05 DIAGNOSIS — Z780 Asymptomatic menopausal state: Secondary | ICD-10-CM | POA: Diagnosis not present

## 2012-10-05 DIAGNOSIS — Z882 Allergy status to sulfonamides status: Secondary | ICD-10-CM | POA: Diagnosis not present

## 2012-10-05 DIAGNOSIS — Z79899 Other long term (current) drug therapy: Secondary | ICD-10-CM | POA: Diagnosis not present

## 2012-10-05 DIAGNOSIS — R079 Chest pain, unspecified: Secondary | ICD-10-CM | POA: Diagnosis not present

## 2012-10-05 DIAGNOSIS — Z7982 Long term (current) use of aspirin: Secondary | ICD-10-CM | POA: Diagnosis not present

## 2012-10-05 DIAGNOSIS — Z888 Allergy status to other drugs, medicaments and biological substances status: Secondary | ICD-10-CM | POA: Diagnosis not present

## 2012-10-05 DIAGNOSIS — R55 Syncope and collapse: Secondary | ICD-10-CM | POA: Diagnosis not present

## 2012-10-05 DIAGNOSIS — F411 Generalized anxiety disorder: Secondary | ICD-10-CM | POA: Diagnosis not present

## 2012-10-05 DIAGNOSIS — I10 Essential (primary) hypertension: Secondary | ICD-10-CM | POA: Diagnosis not present

## 2012-10-05 DIAGNOSIS — I498 Other specified cardiac arrhythmias: Secondary | ICD-10-CM | POA: Diagnosis not present

## 2012-10-05 DIAGNOSIS — R002 Palpitations: Secondary | ICD-10-CM | POA: Diagnosis not present

## 2012-10-05 DIAGNOSIS — K589 Irritable bowel syndrome without diarrhea: Secondary | ICD-10-CM | POA: Diagnosis not present

## 2012-10-06 DIAGNOSIS — Z780 Asymptomatic menopausal state: Secondary | ICD-10-CM | POA: Diagnosis not present

## 2012-10-06 DIAGNOSIS — I10 Essential (primary) hypertension: Secondary | ICD-10-CM | POA: Diagnosis not present

## 2012-10-06 DIAGNOSIS — R002 Palpitations: Secondary | ICD-10-CM | POA: Diagnosis not present

## 2012-10-06 DIAGNOSIS — I498 Other specified cardiac arrhythmias: Secondary | ICD-10-CM | POA: Diagnosis not present

## 2012-10-06 DIAGNOSIS — R55 Syncope and collapse: Secondary | ICD-10-CM

## 2012-10-15 DIAGNOSIS — R000 Tachycardia, unspecified: Secondary | ICD-10-CM | POA: Diagnosis not present

## 2012-11-21 ENCOUNTER — Encounter: Payer: Self-pay | Admitting: Cardiology

## 2012-11-21 DIAGNOSIS — Z882 Allergy status to sulfonamides status: Secondary | ICD-10-CM | POA: Diagnosis not present

## 2012-11-21 DIAGNOSIS — I472 Ventricular tachycardia: Secondary | ICD-10-CM | POA: Diagnosis not present

## 2012-11-21 DIAGNOSIS — Z780 Asymptomatic menopausal state: Secondary | ICD-10-CM | POA: Diagnosis not present

## 2012-11-21 DIAGNOSIS — Z8249 Family history of ischemic heart disease and other diseases of the circulatory system: Secondary | ICD-10-CM | POA: Diagnosis not present

## 2012-11-21 DIAGNOSIS — Z825 Family history of asthma and other chronic lower respiratory diseases: Secondary | ICD-10-CM | POA: Diagnosis not present

## 2012-11-21 DIAGNOSIS — Z79890 Hormone replacement therapy (postmenopausal): Secondary | ICD-10-CM | POA: Diagnosis not present

## 2012-11-21 DIAGNOSIS — R002 Palpitations: Secondary | ICD-10-CM

## 2012-11-21 DIAGNOSIS — Z79899 Other long term (current) drug therapy: Secondary | ICD-10-CM | POA: Diagnosis not present

## 2012-11-21 DIAGNOSIS — Z883 Allergy status to other anti-infective agents status: Secondary | ICD-10-CM | POA: Diagnosis not present

## 2012-11-21 DIAGNOSIS — Z87891 Personal history of nicotine dependence: Secondary | ICD-10-CM | POA: Diagnosis not present

## 2012-11-21 DIAGNOSIS — Z888 Allergy status to other drugs, medicaments and biological substances status: Secondary | ICD-10-CM | POA: Diagnosis not present

## 2012-11-21 DIAGNOSIS — Z823 Family history of stroke: Secondary | ICD-10-CM | POA: Diagnosis not present

## 2012-11-21 DIAGNOSIS — I10 Essential (primary) hypertension: Secondary | ICD-10-CM | POA: Diagnosis not present

## 2012-11-21 DIAGNOSIS — Z7982 Long term (current) use of aspirin: Secondary | ICD-10-CM | POA: Diagnosis not present

## 2012-11-21 DIAGNOSIS — I498 Other specified cardiac arrhythmias: Secondary | ICD-10-CM | POA: Diagnosis not present

## 2012-11-21 DIAGNOSIS — R5381 Other malaise: Secondary | ICD-10-CM | POA: Diagnosis not present

## 2012-11-22 ENCOUNTER — Other Ambulatory Visit: Payer: Self-pay | Admitting: *Deleted

## 2012-11-22 DIAGNOSIS — Z780 Asymptomatic menopausal state: Secondary | ICD-10-CM | POA: Diagnosis not present

## 2012-11-22 DIAGNOSIS — I10 Essential (primary) hypertension: Secondary | ICD-10-CM | POA: Diagnosis not present

## 2012-11-22 DIAGNOSIS — R002 Palpitations: Secondary | ICD-10-CM

## 2012-11-22 DIAGNOSIS — R5381 Other malaise: Secondary | ICD-10-CM | POA: Diagnosis not present

## 2012-11-22 DIAGNOSIS — I498 Other specified cardiac arrhythmias: Secondary | ICD-10-CM | POA: Diagnosis not present

## 2012-11-22 DIAGNOSIS — I472 Ventricular tachycardia: Secondary | ICD-10-CM | POA: Diagnosis not present

## 2012-11-24 DIAGNOSIS — R002 Palpitations: Secondary | ICD-10-CM | POA: Diagnosis not present

## 2012-12-06 DIAGNOSIS — R000 Tachycardia, unspecified: Secondary | ICD-10-CM | POA: Diagnosis not present

## 2012-12-22 ENCOUNTER — Encounter: Payer: Self-pay | Admitting: Cardiology

## 2012-12-23 ENCOUNTER — Encounter: Payer: Self-pay | Admitting: Cardiology

## 2012-12-23 ENCOUNTER — Ambulatory Visit (INDEPENDENT_AMBULATORY_CARE_PROVIDER_SITE_OTHER): Payer: Medicare Other | Admitting: Cardiology

## 2012-12-23 ENCOUNTER — Telehealth: Payer: Self-pay | Admitting: Cardiology

## 2012-12-23 ENCOUNTER — Encounter: Payer: Self-pay | Admitting: *Deleted

## 2012-12-23 VITALS — BP 189/93 | HR 56 | Ht 64.0 in | Wt 163.0 lb

## 2012-12-23 DIAGNOSIS — I10 Essential (primary) hypertension: Secondary | ICD-10-CM | POA: Diagnosis not present

## 2012-12-23 DIAGNOSIS — R5383 Other fatigue: Secondary | ICD-10-CM

## 2012-12-23 DIAGNOSIS — I493 Ventricular premature depolarization: Secondary | ICD-10-CM | POA: Insufficient documentation

## 2012-12-23 DIAGNOSIS — I471 Supraventricular tachycardia: Secondary | ICD-10-CM | POA: Insufficient documentation

## 2012-12-23 DIAGNOSIS — I4949 Other premature depolarization: Secondary | ICD-10-CM

## 2012-12-23 DIAGNOSIS — R0989 Other specified symptoms and signs involving the circulatory and respiratory systems: Secondary | ICD-10-CM

## 2012-12-23 DIAGNOSIS — E785 Hyperlipidemia, unspecified: Secondary | ICD-10-CM

## 2012-12-23 DIAGNOSIS — I498 Other specified cardiac arrhythmias: Secondary | ICD-10-CM

## 2012-12-23 DIAGNOSIS — R0600 Dyspnea, unspecified: Secondary | ICD-10-CM

## 2012-12-23 DIAGNOSIS — R5381 Other malaise: Secondary | ICD-10-CM | POA: Diagnosis not present

## 2012-12-23 NOTE — Assessment & Plan Note (Signed)
Very brief episode noted on monitoring. Would continue low-dose beta blocker and observation.

## 2012-12-23 NOTE — Assessment & Plan Note (Signed)
Blood pressure is high today. She reports typically "no problems" with her blood pressure. I have asked her to check it at home over the next few weeks and record the results.

## 2012-12-23 NOTE — Progress Notes (Signed)
Clinical Summary Brandi [name] is a 72 y.o.female presenting to the office for a post hospital followup. She was seen in consultation by Dr. Antoine Poche at [facility] in June with palpitations. She is here with her granddaughter. She states that her palpitations have improved somewhat on low-dose Toprol-XL, however she still feels fatigued, has to rest more frequently than usual. She does not endorse any exertional chest pallor. Tries to stay fairly active, but has felt limited over the last year. Legs seem to be quite weak. No claudication symptoms.  Outpatient cardiac monitor demonstrated sinus rhythm with occasional frequent PVCs, also brief bursts of SVT. We reviewed this today. She has had no dizziness or syncope.  Echocardiogram from May of this year demonstrated LVEF 55-60% with grade 2 diastolic dysfunction, mild to moderate left atrial enlargement, atrial septal aneurysm, mild to moderate mitral regurgitation, PASP 35-40 mm mercury.  She has not undergone any ischemic testing.   Allergies  Allergen Reactions  . Sulfa Antibiotics Anaphylaxis  . Sudafed (Pseudoephedrine Hcl)     Current Outpatient Prescriptions  Medication Sig Dispense Refill  . aspirin 81 MG EC tablet Take 81 mg by mouth daily.        . Cholecalciferol (VITAMIN D) 2000 UNITS CAPS Take 2,000 Units by mouth as directed.       . dicyclomine (BENTYL) 10 MG capsule Take 10 mg by mouth 2 (two) times daily.       Marland Kitchen estrogens, conjugated, (PREMARIN) 0.625 MG tablet Take 0.625 mg by mouth daily. Take daily for 21 days then do not take for 7 days.       . fish oil-omega-3 fatty acids 1000 MG capsule Take 1 g by mouth daily.       . meclizine (ANTIVERT) 12.5 MG tablet Take 1 tablet (12.5 mg total) by mouth 3 (three) times daily as needed.  90 tablet  0  . metoprolol succinate (TOPROL-XL) 25 MG 24 hr tablet Take 12.5 mg by mouth daily.      . Multiple Vitamin (MULTIVITAMIN) tablet Take 1 tablet by mouth daily.      . pravastatin  (PRAVACHOL) 40 MG tablet Take 40 mg by mouth daily.       No current facility-administered medications for this visit.    Past Medical History  Diagnosis Date  . Diverticulosis   . Sinus bradycardia   . Irritable bowel syndrome   . Essential hypertension, benign   . Mitral regurgitation     Mild to moderate 09/2012  . Mixed hyperlipidemia     Social History Ms. Seidenberg reports that she quit smoking about 14 years ago. Her smoking use included Cigarettes. She smoked 0.00 packs per day. She does not have any smokeless tobacco history on file. Ms. Dearing reports that she does not drink alcohol.  Review of Systems Negative except as outlined.  Physical Examination Filed Vitals:   12/23/12 1508  BP: 189/93  Pulse: 56   Filed Weights   12/23/12 1508  Weight: 163 lb (73.936 kg)   Appears comfortable at rest. HEENT: Conjunctiva and lids normal, oropharynx clear. Neck: Supple, no elevated JVP or carotid bruits, no thyromegaly. Lungs: Clear to auscultation, nonlabored breathing at rest. Cardiac: Regular rate and rhythm, no S3 or significant systolic murmur, no pericardial rub. Abdomen: Soft, nontender,bowel sounds present. Extremities: No pitting edema, distal pulses 2+. Skin: Warm and dry. Musculoskeletal: No kyphosis. Neuropsychiatric: Alert and oriented x3, affect grossly appropriate.   Problem List and Plan   PVC's (premature ventricular  contractions) Documented by cardiac monitoring. No dizziness or syncope. LVEF is normal. In terms of palpitations, she feels somewhat better on low-dose beta blocker.  SVT (supraventricular tachycardia) Very brief episode noted on monitoring. Would continue low-dose beta blocker and observation.  Fatigue Exertional, associated with feeling of weakness in her legs. LVEF is normal by echocardiogram, no major valvular abnormalities. She has not undergone a formal ischemic workup. We will plan to have her undergo an exercise Cardiolite, hold  beta blocker for the test. Followup arranged to review.  Essential hypertension, benign Blood pressure is high today. She reports typically "no problems" with her blood pressure. I have asked her to check it at home over the next few weeks and record the results.  Hyperlipidemia On Pravachol.    Jonelle Sidle, M.D., F.A.C.C.

## 2012-12-23 NOTE — Telephone Encounter (Signed)
Pt has Medicare and [employer].  No precert required 

## 2012-12-23 NOTE — Assessment & Plan Note (Signed)
Exertional, associated with feeling of weakness in her legs. LVEF is normal by echocardiogram, no major valvular abnormalities. She has not undergone a formal ischemic workup. We will plan to have her undergo an exercise Cardiolite, hold beta blocker for the test. Followup arranged to review.

## 2012-12-23 NOTE — Telephone Encounter (Signed)
exercise stress myoview set for 12-28-12 @ [facility]  Checking percert

## 2012-12-23 NOTE — Patient Instructions (Addendum)
Your physician recommends that you schedule a follow-up appointment in: January 13, 2013 @9 :00 am with Dr. Diona Browner. Your physician recommends that you continue on your current medications as directed. Please refer to the Current Medication list given to you today. Your physician has requested that you have en exercise stress myoview. For further information please visit [email or web address] Please follow instruction sheet, as given.

## 2012-12-23 NOTE — Assessment & Plan Note (Signed)
On Pravachol 

## 2012-12-23 NOTE — Assessment & Plan Note (Signed)
Documented by cardiac monitoring. No dizziness or syncope. LVEF is normal. In terms of palpitations, she feels somewhat better on low-dose beta blocker.

## 2012-12-28 ENCOUNTER — Ambulatory Visit: Payer: Medicare Other | Admitting: Family Medicine

## 2012-12-28 DIAGNOSIS — I4949 Other premature depolarization: Secondary | ICD-10-CM | POA: Diagnosis not present

## 2012-12-28 DIAGNOSIS — I10 Essential (primary) hypertension: Secondary | ICD-10-CM | POA: Diagnosis not present

## 2012-12-28 DIAGNOSIS — R5381 Other malaise: Secondary | ICD-10-CM | POA: Diagnosis not present

## 2012-12-28 DIAGNOSIS — I499 Cardiac arrhythmia, unspecified: Secondary | ICD-10-CM

## 2012-12-28 DIAGNOSIS — I498 Other specified cardiac arrhythmias: Secondary | ICD-10-CM | POA: Diagnosis not present

## 2012-12-28 DIAGNOSIS — R0609 Other forms of dyspnea: Secondary | ICD-10-CM | POA: Diagnosis not present

## 2013-01-04 ENCOUNTER — Telehealth: Payer: Self-pay | Admitting: *Deleted

## 2013-01-04 NOTE — Telephone Encounter (Signed)
Message copied by Eustace Moore on Tue Jan 04, 2013 10:00 AM ------      Message from: Jonelle Sidle      Created: Sun Jan 02, 2013  7:33 AM       Reviewed report.  Low risk test without ischemia.  Looks good overall - please let her know. ------

## 2013-01-04 NOTE — Telephone Encounter (Signed)
Patient informed. 

## 2013-01-10 ENCOUNTER — Other Ambulatory Visit: Payer: Self-pay | Admitting: Dermatology

## 2013-01-10 DIAGNOSIS — L570 Actinic keratosis: Secondary | ICD-10-CM | POA: Diagnosis not present

## 2013-01-10 DIAGNOSIS — D239 Other benign neoplasm of skin, unspecified: Secondary | ICD-10-CM | POA: Diagnosis not present

## 2013-01-10 DIAGNOSIS — L821 Other seborrheic keratosis: Secondary | ICD-10-CM | POA: Diagnosis not present

## 2013-01-10 DIAGNOSIS — D485 Neoplasm of uncertain behavior of skin: Secondary | ICD-10-CM | POA: Diagnosis not present

## 2013-01-13 ENCOUNTER — Ambulatory Visit (INDEPENDENT_AMBULATORY_CARE_PROVIDER_SITE_OTHER): Payer: Medicare Other | Admitting: Cardiology

## 2013-01-13 ENCOUNTER — Ambulatory Visit: Admitting: Cardiology

## 2013-01-13 ENCOUNTER — Encounter: Payer: Self-pay | Admitting: Cardiology

## 2013-01-13 VITALS — BP 145/85 | HR 60 | Ht 64.0 in | Wt 162.0 lb

## 2013-01-13 DIAGNOSIS — R5383 Other fatigue: Secondary | ICD-10-CM

## 2013-01-13 DIAGNOSIS — I471 Supraventricular tachycardia: Secondary | ICD-10-CM

## 2013-01-13 DIAGNOSIS — I498 Other specified cardiac arrhythmias: Secondary | ICD-10-CM | POA: Diagnosis not present

## 2013-01-13 DIAGNOSIS — I493 Ventricular premature depolarization: Secondary | ICD-10-CM

## 2013-01-13 DIAGNOSIS — I4949 Other premature depolarization: Secondary | ICD-10-CM | POA: Diagnosis not present

## 2013-01-13 DIAGNOSIS — R5381 Other malaise: Secondary | ICD-10-CM | POA: Diagnosis not present

## 2013-01-13 DIAGNOSIS — I10 Essential (primary) hypertension: Secondary | ICD-10-CM | POA: Diagnosis not present

## 2013-01-13 MED ORDER — HYDROCHLOROTHIAZIDE 12.5 MG PO CAPS: 12.5000 mg | ORAL_CAPSULE | Freq: Every day | ORAL | Status: AC

## 2013-01-13 MED ORDER — METOPROLOL SUCCINATE ER 25 MG PO TB24: 12.5000 mg | ORAL_TABLET | Freq: Every day | ORAL | Status: DC

## 2013-01-13 NOTE — Assessment & Plan Note (Signed)
No obvious ischemia by recent Myoview and normal LVEF. Continue beta blocker.

## 2013-01-13 NOTE — Assessment & Plan Note (Signed)
Continue current regimen and add HCTZ 12.5 mg daily. Followup BMET in 2 weeks. Continue to check blood pressure at home.

## 2013-01-13 NOTE — Assessment & Plan Note (Signed)
Also feeling of leg weakness and discomfort. She will temporarily come off Pravachol to see if it may be related.

## 2013-01-13 NOTE — Patient Instructions (Addendum)
Your physician recommends that you schedule a follow-up appointment in: 6 weeks. Your physician has recommended you make the following change in your medication: Start hydrochlorothiazide 12.5 mg daily. Your new prescription has been sent to your pharmacy. All other medications will remain the same. Your physician recommends that you return for lab work in: 2 weeks around January 27, 2013 for BMET.

## 2013-01-13 NOTE — Assessment & Plan Note (Signed)
Brief episodes noted by cardiac monitoring. Continue low-dose beta blocker.

## 2013-01-13 NOTE — Progress Notes (Signed)
Clinical Summary Brandi [name] is a 72 y.o.female seen recently in July. She was referred for followup ischemic testing at that time. There were no diagnostic ST segment changes by ECG, soft tissue attenuation noted on perfusion imaging with no ischemia, LVEF 87%.  She presents for followup, we reviewed her stress test results. She was hypertensive with testing, reports home blood pressure checks with systolics in the 150s, occasionally lower. She also continues to report feeling of weakness and fatigue in her legs.  Echocardiogram from May of this year demonstrated LVEF 55-60% with grade 2 diastolic dysfunction, mild to moderate left atrial enlargement, atrial septal aneurysm, mild to moderate mitral regurgitation, PASP 35-40 mm mercury. Cardiac monitor done in July demonstrated sinus rhythm with occasional to frequent PVCs, brief episodes of PSVT, no pauses or atrial fibrillation.   Allergies  Allergen Reactions  . Sulfa Antibiotics Anaphylaxis  . Sudafed [Pseudoephedrine Hcl]     Current Outpatient Prescriptions  Medication Sig Dispense Refill  . aspirin 81 MG EC tablet Take 81 mg by mouth daily.        . Cholecalciferol (VITAMIN D) 2000 UNITS CAPS Take 2,000 Units by mouth as directed.       . dicyclomine (BENTYL) 10 MG capsule Take 10 mg by mouth 2 (two) times daily.       Marland Kitchen estrogens, conjugated, (PREMARIN) 0.625 MG tablet Take 0.625 mg by mouth daily.       . fish oil-omega-3 fatty acids 1000 MG capsule Take 1 g by mouth daily.       . meclizine (ANTIVERT) 12.5 MG tablet Take 1 tablet (12.5 mg total) by mouth 3 (three) times daily as needed.  90 tablet  0  . metoprolol succinate (TOPROL-XL) 25 MG 24 hr tablet Take 0.5 tablets (12.5 mg total) by mouth daily.  45 tablet  3  . Multiple Vitamin (MULTIVITAMIN) tablet Take 1 tablet by mouth daily.      . pravastatin (PRAVACHOL) 40 MG tablet Take 40 mg by mouth daily.      . hydrochlorothiazide (MICROZIDE) 12.5 MG capsule Take 1 capsule  (12.5 mg total) by mouth daily.  90 capsule  3   No current facility-administered medications for this visit.    Past Medical History  Diagnosis Date  . Diverticulosis   . Sinus bradycardia   . Irritable bowel syndrome   . Essential hypertension, benign   . Mitral regurgitation     Mild to moderate 09/2012  . Mixed hyperlipidemia     Social History Brandi [name] reports that she quit smoking about 14 years ago. Her smoking use included Cigarettes. She smoked 0.00 packs per day. She does not have any smokeless tobacco history on file. Brandi [name] reports that she does not drink alcohol.  Review of Systems Symptoms some trouble with finding certain words. No focal motor with weakness. Otherwise negative except as outlined.  Physical Examination Filed Vitals:   01/13/13 0851  BP: 145/85  Pulse: 60   Filed Weights   01/13/13 0851  Weight: 162 lb (73.483 kg)    Appears comfortable at rest.  HEENT: Conjunctiva and lids normal, oropharynx clear.  Neck: Supple, no elevated JVP or carotid bruits, no thyromegaly.  Lungs: Clear to auscultation, nonlabored breathing at rest.  Cardiac: Regular rate and rhythm, no S3 or significant systolic murmur, no pericardial rub.  Abdomen: Soft, nontender,bowel sounds present.  Extremities: No pitting edema, distal pulses 2+.  Skin: Warm and dry.  Musculoskeletal: No kyphosis.  Neuropsychiatric: Alert  and oriented x3, affect grossly appropriate.   Problem List and Plan   Essential hypertension, benign Continue current regimen and add HCTZ 12.5 mg daily. Followup BMET in 2 weeks. Continue to check blood pressure at home.  PVC's (premature ventricular contractions) No obvious ischemia by recent Myoview and normal LVEF. Continue beta blocker.  SVT (supraventricular tachycardia) Brief episodes noted by cardiac monitoring. Continue low-dose beta blocker.  Fatigue Also feeling of leg weakness and discomfort. She will temporarily come off  Pravachol to see if it may be related.    Jonelle Sidle, M.D., F.A.C.C.

## 2013-01-20 ENCOUNTER — Ambulatory Visit (INDEPENDENT_AMBULATORY_CARE_PROVIDER_SITE_OTHER): Payer: Medicare Other | Admitting: Family Medicine

## 2013-01-20 ENCOUNTER — Encounter: Payer: Self-pay | Admitting: Family Medicine

## 2013-01-20 VITALS — BP 147/64 | HR 76 | Temp 97.6°F | Ht 63.0 in | Wt 163.2 lb

## 2013-01-20 DIAGNOSIS — I471 Supraventricular tachycardia, unspecified: Secondary | ICD-10-CM

## 2013-01-20 DIAGNOSIS — I10 Essential (primary) hypertension: Secondary | ICD-10-CM | POA: Diagnosis not present

## 2013-01-20 DIAGNOSIS — R5383 Other fatigue: Secondary | ICD-10-CM

## 2013-01-20 DIAGNOSIS — E559 Vitamin D deficiency, unspecified: Secondary | ICD-10-CM

## 2013-01-20 DIAGNOSIS — I4949 Other premature depolarization: Secondary | ICD-10-CM

## 2013-01-20 DIAGNOSIS — E785 Hyperlipidemia, unspecified: Secondary | ICD-10-CM

## 2013-01-20 DIAGNOSIS — I493 Ventricular premature depolarization: Secondary | ICD-10-CM

## 2013-01-20 DIAGNOSIS — R5381 Other malaise: Secondary | ICD-10-CM

## 2013-01-20 DIAGNOSIS — I498 Other specified cardiac arrhythmias: Secondary | ICD-10-CM

## 2013-01-20 NOTE — Progress Notes (Signed)
Patient ID: Brandi [name], female   DOB: [birth year], 73 y.o.   MRN: [id number] SUBJECTIVE: CC: Chief Complaint  Patient presents with  . Follow-up    4 month follow up chronic problems saw dr Diona Browner  last week . states "been thru the works wore heart mointor treadmill     HPI: Patient is here for follow up of hyperlipidemia: denies Headache;denies Chest Pain;denies weakness;denies Shortness of Breath and orthopnea;denies Visual changes;denies palpitations;denies cough;denies pedal edema;denies symptoms of TIA or stroke;deniesClaudication symptoms. admits to Compliance with medications; denies Problems with medications.  Doing much better since the metoprolol.  Past Medical History  Diagnosis Date  . Diverticulosis   . Sinus bradycardia   . Irritable bowel syndrome   . Essential hypertension, benign   . Mitral regurgitation     Mild to moderate 09/2012  . Mixed hyperlipidemia    Past Surgical History  Procedure Laterality Date  . Abdominal hysterectomy  06-02-2008  . Orif hip fracture     History   Social History  . Marital Status: Widowed    Spouse Name: N/A    Number of Children: N/A  . Years of Education: N/A   Occupational History  . Not on file.   Social History Main Topics  . Smoking status: Former Smoker    Types: Cigarettes    Quit date: 05/26/1998  . Smokeless tobacco: Not on file  . Alcohol Use: No  . Drug Use: No  . Sexual Activity: Not on file   Other Topics Concern  . Not on file   Social History Narrative   Widow   2 children   Family History  Problem Relation Age of Onset  . Stroke Mother   . Asthma Father   . COPD Father   . CAD Sister    Current Outpatient Prescriptions on File Prior to Visit  Medication Sig Dispense Refill  . aspirin 81 MG EC tablet Take 81 mg by mouth daily.        . Cholecalciferol (VITAMIN D) 2000 UNITS CAPS Take 2,000 Units by mouth as directed.       . dicyclomine (BENTYL) 10 MG capsule Take 10 mg by mouth 2  (two) times daily.       Marland Kitchen estrogens, conjugated, (PREMARIN) 0.625 MG tablet Take 0.625 mg by mouth daily.       . fish oil-omega-3 fatty acids 1000 MG capsule Take 1 g by mouth daily.       . hydrochlorothiazide (MICROZIDE) 12.5 MG capsule Take 1 capsule (12.5 mg total) by mouth daily.  90 capsule  3  . meclizine (ANTIVERT) 12.5 MG tablet Take 1 tablet (12.5 mg total) by mouth 3 (three) times daily as needed.  90 tablet  0  . metoprolol succinate (TOPROL-XL) 25 MG 24 hr tablet Take 0.5 tablets (12.5 mg total) by mouth daily.  45 tablet  3  . Multiple Vitamin (MULTIVITAMIN) tablet Take 1 tablet by mouth daily.       No current facility-administered medications on file prior to visit.   Allergies  Allergen Reactions  . Sulfa Antibiotics Anaphylaxis  . Sudafed [Pseudoephedrine Hcl]    Immunization History  Administered Date(s) Administered  . DTaP 05/02/2009  . Influenza Whole 02/19/2010, 02/24/2012  . Pneumococcal Conjugate 04/13/2012  . Tdap 04/25/2009   Prior to Admission medications   Medication Sig Start Date End Date Taking? Authorizing Provider  aspirin 81 MG EC tablet Take 81 mg by mouth daily.     Yes  Historical Provider, MD  Cholecalciferol (VITAMIN D) 2000 UNITS CAPS Take 2,000 Units by mouth as directed.    Yes Historical Provider, MD  dicyclomine (BENTYL) 10 MG capsule Take 10 mg by mouth 2 (two) times daily.    Yes Historical Provider, MD  estrogens, conjugated, (PREMARIN) 0.625 MG tablet Take 0.625 mg by mouth daily.    Yes Historical Provider, MD  fish oil-omega-3 fatty acids 1000 MG capsule Take 1 g by mouth daily.    Yes Historical Provider, MD  hydrochlorothiazide (MICROZIDE) 12.5 MG capsule Take 1 capsule (12.5 mg total) by mouth daily. 01/13/13  Yes Jonelle Sidle, MD  meclizine (ANTIVERT) 12.5 MG tablet Take 1 tablet (12.5 mg total) by mouth 3 (three) times daily as needed. 08/24/12  Yes Ileana Ladd, MD  metoprolol succinate (TOPROL-XL) 25 MG 24 hr tablet Take  0.5 tablets (12.5 mg total) by mouth daily. 01/13/13  Yes Jonelle Sidle, MD  Multiple Vitamin (MULTIVITAMIN) tablet Take 1 tablet by mouth daily.   Yes Historical Provider, MD     ROS: As above in the HPI. All other systems are stable or negative.  OBJECTIVE: APPEARANCE:  Patient in no acute distress.The patient appeared well nourished and normally developed. Acyanotic. Waist: VITAL SIGNS:BP 147/64  Pulse 76  Temp(Src) 97.6 F (36.4 C) (Oral)  Ht 5\' 3"  (1.6 m)  Wt 163 lb 3.2 oz (74.027 kg)  BMI 28.92 kg/m2 WF  SKIN: warm and  Dry without overt rashes, tattoos and scars  HEAD and Neck: without JVD, Head and scalp: normal Eyes:No scleral icterus. Fundi normal, eye movements no change with the abnormality Ears: Auricle normal, canal normal, Tympanic membranes normal, insufflation normal. Nose: normal Throat: normal Neck & thyroid: normal  CHEST & LUNGS: Chest wall: normal Lungs: Clear  CVS: Reveals the PMI to be normally located. Regular rhythm, First and Second Heart sounds are normal,  absence of murmurs, rubs or gallops. Peripheral vasculature: Radial pulses: normal Dorsal pedis pulses: normal Posterior pulses: normal  ABDOMEN:  Appearance: normal Benign, no organomegaly, no masses, no Abdominal Aortic enlargement. No Guarding , no rebound. No Bruits. Bowel sounds: normal  RECTAL: N/A GU: N/A  EXTREMETIES: nonedematous.  MUSCULOSKELETAL:  Spine: normal Joints: intact  NEUROLOGIC: oriented to time,place and person; nonfocal. Strength is normal Sensory is normal Reflexes are normal Cranial Nerves are normal.  Results for orders placed in visit on 08/24/12  BASIC METABOLIC PANEL WITH GFR      Result Value Range   Sodium 138  135 - 145 mEq/L   Potassium 4.6  3.5 - 5.3 mEq/L   Chloride 105  96 - 112 mEq/L   CO2 22  19 - 32 mEq/L   Glucose, Bld 82  70 - 99 mg/dL   BUN 10  6 - 23 mg/dL   Creat 1.61  0.96 - 0.45 mg/dL   Calcium 9.5  8.4 - 40.9  mg/dL   GFR, Est African American 82     GFR, Est Non African American 71    NMR LIPOPROFILE WITH LIPIDS      Result Value Range   LDL Particle Number 1065 (*) <1000 nmol/L   LDL (calc) 60  <100 mg/dL   HDL-C 68  >=81 mg/dL   Triglycerides 191  <478 mg/dL   Cholesterol, Total 295  <200 mg/dL   HDL Particle Number 62.1  >=30.8 umol/L   Large HDL-P 11.4  >=4.8 umol/L   Large VLDL-P 3.4 (*) <=2.7 nmol/L   Small  LDL Particle Number 720 (*) <=527 nmol/L   LDL Size 20.5 (*) >20.5 nm   HDL Size 9.2  >=9.2 nm   VLDL Size 49.1 (*) >=46.6 nm   LP-IR Score 44  <=45  HEPATIC FUNCTION PANEL      Result Value Range   Total Bilirubin 0.3  0.3 - 1.2 mg/dL   Bilirubin, Direct 0.1  0.0 - 0.3 mg/dL   Indirect Bilirubin 0.2  0.0 - 0.9 mg/dL   Alkaline Phosphatase 62  39 - 117 U/L   AST 20  0 - 37 U/L   ALT 19  0 - 35 U/L   Total Protein 6.1  6.0 - 8.3 g/dL   Albumin 3.8  3.5 - 5.2 g/dL  VITAMIN D 25 HYDROXY      Result Value Range   Vit D, 25-Hydroxy 44  30 - 89 ng/mL    ASSESSMENT: HLD (hyperlipidemia) - Plan: NMR, lipoprofile  Essential hypertension, benign  Fatigue  Hyperlipidemia  PVC's (premature ventricular contractions)  SVT (supraventricular tachycardia)  Vitamin D deficiency - Plan: Vit D  25 hydroxy (rtn osteoporosis monitoring)   PLAN:  Orders Placed This Encounter  Procedures  . NMR, lipoprofile    Standing Status: Future     Number of Occurrences:      Standing Expiration Date: 01/20/2014  . Vit D  25 hydroxy (rtn osteoporosis monitoring)    Standing Status: Future     Number of Occurrences:      Standing Expiration Date: 01/20/2014    No change in medications. Healthy diet Keep active and exercise.  Return in about 4 months (around 05/22/2013) for Recheck medical problems.  Valyncia Wiens P. Modesto Charon, M.D.

## 2013-01-27 ENCOUNTER — Ambulatory Visit (INDEPENDENT_AMBULATORY_CARE_PROVIDER_SITE_OTHER): Payer: Medicare Other

## 2013-01-27 ENCOUNTER — Other Ambulatory Visit: Payer: Medicare Other

## 2013-01-27 DIAGNOSIS — Z23 Encounter for immunization: Secondary | ICD-10-CM

## 2013-01-27 DIAGNOSIS — I10 Essential (primary) hypertension: Secondary | ICD-10-CM

## 2013-01-27 NOTE — Progress Notes (Signed)
Patient came in for labs only.

## 2013-01-28 ENCOUNTER — Encounter: Payer: Self-pay | Admitting: *Deleted

## 2013-01-28 LAB — BMP8+EGFR
GFR calc Af Amer: 82 mL/min/{1.73_m2} (ref >59)
GFR calc non Af Amer: 71 mL/min/{1.73_m2} (ref >59)
Glucose: 80 mg/dL (ref 65–99)
Potassium: 4.9 mmol/L (ref 3.5–5.2)
Sodium: 140 mmol/L (ref 134–144)

## 2013-02-07 ENCOUNTER — Encounter: Payer: Self-pay | Admitting: Family Medicine

## 2013-02-07 ENCOUNTER — Ambulatory Visit (INDEPENDENT_AMBULATORY_CARE_PROVIDER_SITE_OTHER): Payer: Medicare Other

## 2013-02-07 ENCOUNTER — Telehealth: Payer: Self-pay | Admitting: Family Medicine

## 2013-02-07 ENCOUNTER — Ambulatory Visit (INDEPENDENT_AMBULATORY_CARE_PROVIDER_SITE_OTHER): Payer: Medicare Other | Admitting: Family Medicine

## 2013-02-07 VITALS — BP 137/68 | HR 72 | Temp 97.3°F | Ht 64.0 in | Wt 162.0 lb

## 2013-02-07 DIAGNOSIS — J209 Acute bronchitis, unspecified: Secondary | ICD-10-CM

## 2013-02-07 DIAGNOSIS — R0989 Other specified symptoms and signs involving the circulatory and respiratory systems: Secondary | ICD-10-CM

## 2013-02-07 MED ORDER — GUAIFENESIN-CODEINE 100-10 MG/5ML PO SYRP: 5.0000 mL | ORAL_SOLUTION | Freq: Three times a day (TID) | ORAL | Status: DC | PRN

## 2013-02-07 MED ORDER — AZITHROMYCIN 250 MG PO TABS: ORAL_TABLET | ORAL | Status: DC

## 2013-02-07 NOTE — Telephone Encounter (Signed)
Sinus congestion, sore throat, and runny nose for 4 days. Has taken Mucinex. Appt scheduled.  Patient aware

## 2013-02-07 NOTE — Progress Notes (Signed)
Patient ID: Brandi [name], female   DOB: 10/21/1940, 72 y.o.   MRN: [id number] SUBJECTIVE: CC: Chief Complaint  Patient presents with  . Cough    sinus infection  cough congestion    HPI: Symptoms started 4 days ago. With a sore throat, then runny nose, color change to phlegm. No blood. Fever : a little 2 days ago. Today the sore throat is better but congested, no SOB,no chest pain, no wheezing.  Past Medical History  Diagnosis Date  . Diverticulosis   . Sinus bradycardia   . Irritable bowel syndrome   . Essential hypertension, benign   . Mitral regurgitation     Mild to moderate 09/2012  . Mixed hyperlipidemia    Past Surgical History  Procedure Laterality Date  . Abdominal hysterectomy  06-02-2008  . Orif hip fracture     History   Social History  . Marital Status: Widowed    Spouse Name: N/A    Number of Children: N/A  . Years of Education: N/A   Occupational History  . Not on file.   Social History Main Topics  . Smoking status: Former Smoker    Types: Cigarettes    Quit date: 05/26/1998  . Smokeless tobacco: Not on file  . Alcohol Use: No  . Drug Use: No  . Sexual Activity: Not on file   Other Topics Concern  . Not on file   Social History Narrative   Widow   2 children   Family History  Problem Relation Age of Onset  . Stroke Mother   . Asthma Father   . COPD Father   . CAD Sister    Current Outpatient Prescriptions on File Prior to Visit  Medication Sig Dispense Refill  . aspirin 81 MG EC tablet Take 81 mg by mouth daily.        . Cholecalciferol (VITAMIN D) 2000 UNITS CAPS Take 2,000 Units by mouth as directed.       . dicyclomine (BENTYL) 10 MG capsule Take 10 mg by mouth 2 (two) times daily.       Marland Kitchen estrogens, conjugated, (PREMARIN) 0.625 MG tablet Take 0.625 mg by mouth daily.       . fish oil-omega-3 fatty acids 1000 MG capsule Take 1 g by mouth daily.       . hydrochlorothiazide (MICROZIDE) 12.5 MG capsule Take 1 capsule (12.5 mg total) by  mouth daily.  90 capsule  3  . meclizine (ANTIVERT) 12.5 MG tablet Take 1 tablet (12.5 mg total) by mouth 3 (three) times daily as needed.  90 tablet  0  . metoprolol succinate (TOPROL-XL) 25 MG 24 hr tablet Take 0.5 tablets (12.5 mg total) by mouth daily.  45 tablet  3  . Multiple Vitamin (MULTIVITAMIN) tablet Take 1 tablet by mouth daily.       No current facility-administered medications on file prior to visit.   Allergies  Allergen Reactions  . Sulfa Antibiotics Anaphylaxis  . Sudafed [Pseudoephedrine Hcl]    Immunization History  Administered Date(s) Administered  . DTaP 05/02/2009  . Influenza Whole 02/19/2010, 02/24/2012  . Pneumococcal Conjugate 04/13/2012  . Tdap 04/25/2009  . Zoster 01/27/2013   Prior to Admission medications   Medication Sig Start Date End Date Taking? Authorizing Provider  aspirin 81 MG EC tablet Take 81 mg by mouth daily.     Yes Historical Provider, MD  Cholecalciferol (VITAMIN D) 2000 UNITS CAPS Take 2,000 Units by mouth as directed.    Yes  Historical Provider, MD  dicyclomine (BENTYL) 10 MG capsule Take 10 mg by mouth 2 (two) times daily.    Yes Historical Provider, MD  estrogens, conjugated, (PREMARIN) 0.625 MG tablet Take 0.625 mg by mouth daily.    Yes Historical Provider, MD  fish oil-omega-3 fatty acids 1000 MG capsule Take 1 g by mouth daily.    Yes Historical Provider, MD  hydrochlorothiazide (MICROZIDE) 12.5 MG capsule Take 1 capsule (12.5 mg total) by mouth daily. 01/13/13  Yes Jonelle Sidle, MD  meclizine (ANTIVERT) 12.5 MG tablet Take 1 tablet (12.5 mg total) by mouth 3 (three) times daily as needed. 08/24/12  Yes Ileana Ladd, MD  metoprolol succinate (TOPROL-XL) 25 MG 24 hr tablet Take 0.5 tablets (12.5 mg total) by mouth daily. 01/13/13  Yes Jonelle Sidle, MD  Multiple Vitamin (MULTIVITAMIN) tablet Take 1 tablet by mouth daily.   Yes Historical Provider, MD  azithromycin (ZITHROMAX Z-PAK) 250 MG tablet 2 tabs on Day 1, then 1 tab on  day 2 to 5. 02/07/13   Ileana Ladd, MD  guaiFENesin-codeine [facility]) 100-10 MG/5ML syrup Take 5 mLs by mouth 3 (three) times daily as needed for cough. 02/07/13   Ileana Ladd, MD     ROS: As above in the HPI. All other systems are stable or negative.  OBJECTIVE: APPEARANCE:  Patient in no acute distress.The patient appeared well nourished and normally developed. Acyanotic. Waist: VITAL SIGNS:BP 137/68  Pulse 72  Temp(Src) 97.3 F (36.3 C) (Oral)  Ht 5\' 4"  (1.626 m)  Wt 162 lb (73.483 kg)  BMI 27.79 kg/m2 WF  SKIN: warm and  Dry without overt rashes, tattoos and scars  HEAD and Neck: without JVD, Head and scalp: normal Eyes:No scleral icterus. prosthesis Ears: Auricle normal, canal normal, Tympanic membranes normal, insufflation normal. Nose: coryza Throat: red Neck & thyroid: normal  CHEST & LUNGS: Chest wall: normal Lungs: Coarse,no rales, no wheezes  CVS: Reveals the PMI to be normally located. Regular rhythm, First and Second Heart sounds are normal,  absence of murmurs, rubs or gallops. Peripheral vasculature: Radial pulses: normal Dorsal pedis pulses: normal Posterior pulses: normal  ABDOMEN:  Appearance: normal Benign, no organomegaly, no masses, no Abdominal Aortic enlargement. No Guarding , no rebound. No Bruits. Bowel sounds: normal  RECTAL: N/A GU: N/A  EXTREMETIES: nonedematous.  MUSCULOSKELETAL:  Spine: normal Joints: intact  NEUROLOGIC: oriented to time,place and person; nonfocal.  ASSESSMENT: Chest congestion - Plan: DG Chest 2 View, azithromycin (ZITHROMAX Z-PAK) 250 MG tablet, guaiFENesin-codeine (ROBITUSSIN AC) 100-10 MG/5ML syrup  Acute bronchitis - Plan: azithromycin (ZITHROMAX Z-PAK) 250 MG tablet, guaiFENesin-codeine (ROBITUSSIN AC) 100-10 MG/5ML syrup  PLAN: Orders Placed This Encounter  Procedures  . DG Chest 2 View    Standing Status: Future     Number of Occurrences: 1     Standing Expiration Date: 04/09/2014     Order Specific Question:  Reason for Exam (SYMPTOM  OR DIAGNOSIS REQUIRED)    Answer:  chest congestion and cough    Order Specific Question:  Preferred imaging location?    Answer:  Internal   Meds ordered this encounter  Medications  . azithromycin (ZITHROMAX Z-PAK) 250 MG tablet    Sig: 2 tabs on Day 1, then 1 tab on day 2 to 5.    Dispense:  6 each    Refill:  0  . guaiFENesin-codeine (ROBITUSSIN AC) 100-10 MG/5ML syrup    Sig: Take 5 mLs by mouth 3 (three) times daily  as needed for cough.    Dispense:  120 mL    Refill:  0    [facility] reading (PRIMARY) by  Dr. Modesto Charon : no infiltrates , no consolidation  Fluids rest.                                 No Follow-up on file. Return for routine appointment.   Karis Emig P. [place]esto Charon, M.D.

## 2013-02-07 NOTE — Telephone Encounter (Signed)
[facility] notified of rx for cough med and pt aware

## 2013-02-25 DIAGNOSIS — I10 Essential (primary) hypertension: Secondary | ICD-10-CM | POA: Diagnosis not present

## 2013-02-25 DIAGNOSIS — Z0000 Encounter for general adult medical examination without abnormal findings: Secondary | ICD-10-CM | POA: Diagnosis not present

## 2013-02-28 ENCOUNTER — Ambulatory Visit (INDEPENDENT_AMBULATORY_CARE_PROVIDER_SITE_OTHER): Payer: Medicare Other | Admitting: Cardiology

## 2013-02-28 ENCOUNTER — Encounter: Payer: Self-pay | Admitting: Cardiology

## 2013-02-28 VITALS — BP 139/81 | HR 71 | Ht 64.0 in | Wt 163.1 lb

## 2013-02-28 DIAGNOSIS — I10 Essential (primary) hypertension: Secondary | ICD-10-CM | POA: Diagnosis not present

## 2013-02-28 DIAGNOSIS — I498 Other specified cardiac arrhythmias: Secondary | ICD-10-CM

## 2013-02-28 DIAGNOSIS — I471 Supraventricular tachycardia: Secondary | ICD-10-CM

## 2013-02-28 NOTE — Patient Instructions (Addendum)

## 2013-02-28 NOTE — Assessment & Plan Note (Signed)
Continue current regimen

## 2013-02-28 NOTE — Assessment & Plan Note (Signed)
No significant palpitations. She continues on low-dose beta blocker for now.

## 2013-02-28 NOTE — Progress Notes (Signed)
    Clinical Summary Brandi [name] is a 72 y.o.female last seen in August for followup of cardiac testing. Medication adjustments were made at that time. Can she doesn't that she is feeling better, fewer palpitations. She brought in home blood pressure checks, generally they look to be in a reasonable range from 110s to 140s. She is tolerating the medication adjustments that we made.  Recent lab work reviewed finding BUN 12, creatinine 0.8, potassium 4.9.   Allergies  Allergen Reactions  . Sulfa Antibiotics Anaphylaxis  . Sudafed [Pseudoephedrine Hcl]     Current Outpatient Prescriptions  Medication Sig Dispense Refill  . aspirin 81 MG EC tablet Take 81 mg by mouth daily.        . Cholecalciferol (VITAMIN D) 2000 UNITS CAPS Take 2,000 Units by mouth daily.       Marland Kitchen dicyclomine (BENTYL) 10 MG capsule Take 10 mg by mouth 2 (two) times daily.       Marland Kitchen estrogens, conjugated, (PREMARIN) 0.625 MG tablet Take 0.625 mg by mouth daily.       . fish oil-omega-3 fatty acids 1000 MG capsule Take 1 g by mouth daily.       . hydrochlorothiazide (MICROZIDE) 12.5 MG capsule Take 1 capsule (12.5 mg total) by mouth daily.  90 capsule  3  . meclizine (ANTIVERT) 12.5 MG tablet Take 1 tablet (12.5 mg total) by mouth 3 (three) times daily as needed.  90 tablet  0  . metoprolol succinate (TOPROL-XL) 25 MG 24 hr tablet Take 0.5 tablets (12.5 mg total) by mouth daily.  45 tablet  3  . Multiple Vitamin (MULTIVITAMIN) tablet Take 1 tablet by mouth daily.       No current facility-administered medications for this visit.    Past Medical History  Diagnosis Date  . Diverticulosis   . Sinus bradycardia   . Irritable bowel syndrome   . Essential hypertension, benign   . Mitral regurgitation     Mild to moderate 09/2012  . Mixed hyperlipidemia     Social History Brandi [name] reports that she quit smoking about 14 years ago. Her smoking use included Cigarettes. She smoked 0.00 packs per day. She does not have any  smokeless tobacco history on file. Brandi [name] reports that she does not drink alcohol.  Review of Systems Negative except as outlined.  Physical Examination Filed Vitals:   02/28/13 1456  BP: 139/81  Pulse: 71   Filed Weights   02/28/13 1456  Weight: 163 lb 1.9 oz (73.991 kg)    Appears comfortable at rest.  HEENT: Conjunctiva and lids normal, oropharynx clear.  Neck: Supple, no elevated JVP or carotid bruits, no thyromegaly.  Lungs: Clear to auscultation, nonlabored breathing at rest.  Cardiac: Regular rate and rhythm, no S3 or significant systolic murmur, no pericardial rub.  Abdomen: Soft, nontender,bowel sounds present.  Extremities: No pitting edema, distal pulses 2+.    Problem List and Plan   Essential hypertension, benign Continue current regimen.  SVT (supraventricular tachycardia) No significant palpitations. She continues on low-dose beta blocker for now.    Jonelle Sidle, M.D., F.A.C.C.

## 2013-04-05 ENCOUNTER — Ambulatory Visit (INDEPENDENT_AMBULATORY_CARE_PROVIDER_SITE_OTHER): Payer: Medicare Other

## 2013-04-05 DIAGNOSIS — Z23 Encounter for immunization: Secondary | ICD-10-CM | POA: Diagnosis not present

## 2013-05-24 ENCOUNTER — Ambulatory Visit: Payer: Medicare Other | Admitting: Family Medicine

## 2013-05-30 DIAGNOSIS — E782 Mixed hyperlipidemia: Secondary | ICD-10-CM | POA: Diagnosis not present

## 2013-05-30 DIAGNOSIS — M549 Dorsalgia, unspecified: Secondary | ICD-10-CM | POA: Diagnosis not present

## 2013-05-30 DIAGNOSIS — I10 Essential (primary) hypertension: Secondary | ICD-10-CM | POA: Diagnosis not present

## 2013-06-10 ENCOUNTER — Encounter: Payer: Self-pay | Admitting: Family Medicine

## 2013-06-10 ENCOUNTER — Ambulatory Visit (INDEPENDENT_AMBULATORY_CARE_PROVIDER_SITE_OTHER): Payer: Medicare Other | Admitting: Family Medicine

## 2013-06-10 VITALS — BP 131/72 | HR 93 | Temp 97.9°F | Ht 64.0 in | Wt 161.8 lb

## 2013-06-10 DIAGNOSIS — I498 Other specified cardiac arrhythmias: Secondary | ICD-10-CM

## 2013-06-10 DIAGNOSIS — I10 Essential (primary) hypertension: Secondary | ICD-10-CM

## 2013-06-10 DIAGNOSIS — E559 Vitamin D deficiency, unspecified: Secondary | ICD-10-CM

## 2013-06-10 DIAGNOSIS — I471 Supraventricular tachycardia, unspecified: Secondary | ICD-10-CM

## 2013-06-10 DIAGNOSIS — E785 Hyperlipidemia, unspecified: Secondary | ICD-10-CM | POA: Diagnosis not present

## 2013-06-10 NOTE — Patient Instructions (Signed)
DASH Diet  The DASH diet stands for "Dietary Approaches to Stop Hypertension." It is a healthy eating plan that has been shown to reduce high blood pressure (hypertension) in as little as 14 days, while also possibly providing other significant health benefits. These other health benefits include reducing the risk of breast cancer after menopause and reducing the risk of type 2 diabetes, heart disease, colon cancer, and stroke. Health benefits also include weight loss and slowing kidney failure in patients with chronic kidney disease.   DIET GUIDELINES  · Limit salt (sodium). Your diet should contain less than 1500 mg of sodium daily.  · Limit refined or processed carbohydrates. Your diet should include mostly whole grains. Desserts and added sugars should be used sparingly.  · Include small amounts of heart-healthy fats. These types of fats include nuts, oils, and tub margarine. Limit saturated and trans fats. These fats have been shown to be harmful in the body.  CHOOSING FOODS   The following food groups are based on a 2000 calorie diet. See your Registered Dietitian for individual calorie needs.  Grains and Grain Products (6 to 8 servings daily)  · Eat More Often: Whole-wheat bread, brown rice, whole-grain or wheat pasta, quinoa, popcorn without added fat or salt (air popped).  · Eat Less Often: White bread, white pasta, white rice, cornbread.  Vegetables (4 to 5 servings daily)  · Eat More Often: Fresh, frozen, and canned vegetables. Vegetables may be raw, steamed, roasted, or grilled with a minimal amount of fat.  · Eat Less Often/Avoid: Creamed or fried vegetables. Vegetables in a cheese sauce.  Fruit (4 to 5 servings daily)  · Eat More Often: All fresh, canned (in natural juice), or frozen fruits. Dried fruits without added sugar. One hundred percent fruit juice (½ cup [237 mL] daily).  · Eat Less Often: Dried fruits with added sugar. Canned fruit in light or heavy syrup.  Lean Meats, Fish, and Poultry (2  servings or less daily. One serving is 3 to 4 oz [85-114 g]).  · Eat More Often: Ninety percent or leaner ground beef, tenderloin, sirloin. Round cuts of beef, chicken breast, turkey breast. All fish. Grill, bake, or broil your meat. Nothing should be fried.  · Eat Less Often/Avoid: Fatty cuts of meat, turkey, or chicken leg, thigh, or wing. Fried cuts of meat or fish.  Dairy (2 to 3 servings)  · Eat More Often: Low-fat or fat-free milk, low-fat plain or light yogurt, reduced-fat or part-skim cheese.  · Eat Less Often/Avoid: Milk (whole, 2%). Whole milk yogurt. Full-fat cheeses.  Nuts, Seeds, and Legumes (4 to 5 servings per week)  · Eat More Often: All without added salt.  · Eat Less Often/Avoid: Salted nuts and seeds, canned beans with added salt.  Fats and Sweets (limited)  · Eat More Often: Vegetable oils, tub margarines without trans fats, sugar-free gelatin. Mayonnaise and salad dressings.  · Eat Less Often/Avoid: Coconut oils, palm oils, butter, stick margarine, cream, half and half, cookies, candy, pie.  FOR MORE INFORMATION  The Dash Diet Eating Plan: www.dashdiet.org  Document Released: 05/01/2011 Document Revised: 08/04/2011 Document Reviewed: 05/01/2011  [employer]® Patient Information ©2014 [employer].

## 2013-06-10 NOTE — Progress Notes (Signed)
Patient ID: Brandi [name], female   DOB: 1941-02-08, 73 y.o.   MRN: [id number] SUBJECTIVE: CC: Chief Complaint  Patient presents with  . Follow-up    4 month follow up chronic problems     HPI: Patient is here for follow up of hyperlipidemia/HTN/SVT: denies Headache;denies Chest Pain;denies weakness;denies Shortness of Breath and orthopnea;denies Visual changes;denies palpitations;denies cough;denies pedal edema;denies symptoms of TIA or stroke;deniesClaudication symptoms. admits to Compliance with medications; denies Problems with medications.  Has been doing well. Recently saw the cardiologists and she is fine.   Past Medical History  Diagnosis Date  . Diverticulosis   . Sinus bradycardia   . Irritable bowel syndrome   . Essential hypertension, benign   . Mitral regurgitation     Mild to moderate 09/2012  . Mixed hyperlipidemia    Past Surgical History  Procedure Laterality Date  . Abdominal hysterectomy  06-02-2008  . Orif hip fracture     History   Social History  . Marital Status: Widowed    Spouse Name: N/A    Number of Children: N/A  . Years of Education: N/A   Occupational History  . Not on file.   Social History Main Topics  . Smoking status: Former Smoker    Types: Cigarettes    Quit date: 05/26/1998  . Smokeless tobacco: Not on file  . Alcohol Use: No  . Drug Use: No  . Sexual Activity: Not on file   Other Topics Concern  . Not on file   Social History Narrative   Widow   2 children   Family History  Problem Relation Age of Onset  . Stroke Mother   . Asthma Father   . COPD Father   . CAD Sister    Current Outpatient Prescriptions on File Prior to Visit  Medication Sig Dispense Refill  . aspirin 81 MG EC tablet Take 81 mg by mouth daily.        . Cholecalciferol (VITAMIN D) 2000 UNITS CAPS Take 2,000 Units by mouth daily.       Marland Kitchen dicyclomine (BENTYL) 10 MG capsule Take 10 mg by mouth 2 (two) times daily.       Marland Kitchen estrogens, conjugated,  (PREMARIN) 0.625 MG tablet Take 0.625 mg by mouth daily.       . fish oil-omega-3 fatty acids 1000 MG capsule Take 1 g by mouth daily.       . hydrochlorothiazide (MICROZIDE) 12.5 MG capsule Take 1 capsule (12.5 mg total) by mouth daily.  90 capsule  3  . meclizine (ANTIVERT) 12.5 MG tablet Take 1 tablet (12.5 mg total) by mouth 3 (three) times daily as needed.  90 tablet  0  . metoprolol succinate (TOPROL-XL) 25 MG 24 hr tablet Take 0.5 tablets (12.5 mg total) by mouth daily.  45 tablet  3  . Multiple Vitamin (MULTIVITAMIN) tablet Take 1 tablet by mouth daily.       No current facility-administered medications on file prior to visit.   Allergies  Allergen Reactions  . Sulfa Antibiotics Anaphylaxis  . Sudafed [Pseudoephedrine Hcl]    Immunization History  Administered Date(s) Administered  . DTaP 05/02/2009  . Influenza Whole 02/19/2010, 02/24/2012  . Influenza,inj,Quad PF,36+ Mos Aug 13, 202014  . Pneumococcal Conjugate-13 04/13/2012  . Tdap 04/25/2009  . Zoster 01/27/2013   Prior to Admission medications   Medication Sig Start Date End Date Taking? Authorizing Provider  aspirin 81 MG EC tablet Take 81 mg by mouth daily.  Historical Provider, MD  Cholecalciferol (VITAMIN D) 2000 UNITS CAPS Take 2,000 Units by mouth daily.     Historical Provider, MD  dicyclomine (BENTYL) 10 MG capsule Take 10 mg by mouth 2 (two) times daily.     Historical Provider, MD  estrogens, conjugated, (PREMARIN) 0.625 MG tablet Take 0.625 mg by mouth daily.     Historical Provider, MD  fish oil-omega-3 fatty acids 1000 MG capsule Take 1 g by mouth daily.     Historical Provider, MD  hydrochlorothiazide (MICROZIDE) 12.5 MG capsule Take 1 capsule (12.5 mg total) by mouth daily. 01/13/13   Satira Sark, MD  meclizine (ANTIVERT) 12.5 MG tablet Take 1 tablet (12.5 mg total) by mouth 3 (three) times daily as needed. 08/24/12   Vernie Shanks, MD  metoprolol succinate (TOPROL-XL) 25 MG 24 hr tablet Take 0.5  tablets (12.5 mg total) by mouth daily. 01/13/13   Satira Sark, MD  Multiple Vitamin (MULTIVITAMIN) tablet Take 1 tablet by mouth daily.    Historical Provider, MD     ROS: As above in the HPI. All other systems are stable or negative.  OBJECTIVE: APPEARANCE:  Patient in no acute distress.The patient appeared well nourished and normally developed. Acyanotic. Waist: VITAL SIGNS:BP 131/72  Pulse 93  Temp(Src) 97.9 F (36.6 C) (Oral)  Ht '5\' 4"'  (1.626 m)  Wt 161 lb 12.8 oz (73.392 kg)  BMI 27.76 kg/m2 WF  SKIN: warm and  Dry without overt rashes, tattoos and scars  HEAD and Neck: without JVD, Head and scalp: normal Eyes:No scleral icterus. Fundi normal, no change . Has a eye prosthesis Ears: Auricle normal, canal normal, Tympanic membranes normal, insufflation normal. Nose: normal Throat: normal Neck & thyroid: normal  CHEST & LUNGS: Chest wall: normal Lungs: Clear  CVS: Reveals the PMI to be normally located. Regular rhythm, First and Second Heart sounds are normal,  absence of murmurs, rubs or gallops. Peripheral vasculature: Radial pulses: normal Dorsal pedis pulses: normal Posterior pulses: normal  ABDOMEN:  Appearance: normal Benign, no organomegaly, no masses, no Abdominal Aortic enlargement. No Guarding , no rebound. No Bruits. Bowel sounds: normal  RECTAL: N/A GU: N/A  EXTREMETIES: nonedematous.  MUSCULOSKELETAL:  Spine: normal Joints: intact  NEUROLOGIC: oriented to time,place and person; nonfocal.  Results for orders placed in visit on 01/27/13  BMP8+EGFR      Result Value Range   Glucose 80  65 - 99 mg/dL   BUN 12  8 - 27 mg/dL   Creatinine, Ser 0.82  0.57 - 1.00 mg/dL   GFR calc non Af Amer 71  >59 mL/min/1.73   GFR calc Af Amer 82  >59 mL/min/1.73   BUN/Creatinine Ratio 15  11 - 26   Sodium 140  134 - 144 mmol/L   Potassium 4.9  3.5 - 5.2 mmol/L   Chloride 101  97 - 108 mmol/L   CO2 24  18 - 29 mmol/L   Calcium 9.3  8.6 - 10.2  mg/dL    ASSESSMENT: Essential hypertension, benign - Plan: CMP14+EGFR  Hyperlipidemia - Plan: CMP14+EGFR, NMR, lipoprofile  SVT (supraventricular tachycardia)  Vitamin D deficiency - Plan: Vit D  25 hydroxy (rtn osteoporosis monitoring)  PLAN:  DASH Diet Same regimen. Continue with her healthy lifestyle.  Orders Placed This Encounter  Procedures  . CMP14+EGFR  . NMR, lipoprofile  . Vit D  25 hydroxy (rtn osteoporosis monitoring)   No orders of the defined types were placed in this encounter.   There  are no discontinued medications. Return in about 3 months (around 09/08/2013) for Recheck medical problems.  Semaj Coburn P. Jacelyn Grip, M.D.

## 2013-06-12 LAB — CMP14+EGFR
ALT: 16 IU/L (ref 0–32)
AST: 20 IU/L (ref 0–40)
Albumin/Globulin Ratio: 1.5 (ref 1.1–2.5)
Albumin: 3.7 g/dL (ref 3.5–4.8)
Alkaline Phosphatase: 64 IU/L (ref 39–117)
BUN/Creatinine Ratio: 14 (ref 11–26)
BUN: 10 mg/dL (ref 8–27)
CO2: 24 mmol/L (ref 18–29)
Calcium: 9.2 mg/dL (ref 8.6–10.2)
Chloride: 99 mmol/L (ref 97–108)
Creatinine, Ser: 0.74 mg/dL (ref 0.57–1.00)
GFR calc Af Amer: 92 mL/min/{1.73_m2} (ref >59)
GFR calc non Af Amer: 80 mL/min/{1.73_m2} (ref >59)
Globulin, Total: 2.4 g/dL (ref 1.5–4.5)
Glucose: 81 mg/dL (ref 65–99)
Potassium: 4.4 mmol/L (ref 3.5–5.2)
Sodium: 141 mmol/L (ref 134–144)
Total Bilirubin: 0.2 mg/dL (ref 0.0–1.2)
Total Protein: 6.1 g/dL (ref 6.0–8.5)

## 2013-06-12 LAB — NMR, LIPOPROFILE
Cholesterol: 203 mg/dL — ABNORMAL HIGH (ref <200)
HDL Cholesterol by NMR: 66 mg/dL (ref >=40)
HDL Particle Number: 49.5 umol/L (ref >=30.5)
LDL Particle Number: 1546 nmol/L — ABNORMAL HIGH (ref <1000)
LDL Size: 20.1 nm — ABNORMAL LOW (ref >20.5)
LDLC SERPL CALC-MCNC: 86 mg/dL (ref <100)
LP-IR Score: 47 — ABNORMAL HIGH (ref <=45)
Small LDL Particle Number: 1114 nmol/L — ABNORMAL HIGH (ref <=527)
Triglycerides by NMR: 255 mg/dL — ABNORMAL HIGH (ref <150)

## 2013-06-12 LAB — VITAMIN D 25 HYDROXY (VIT D DEFICIENCY, FRACTURES): Vit D, 25-Hydroxy: 29.1 ng/mL — ABNORMAL LOW (ref 30.0–100.0)

## 2013-06-27 DIAGNOSIS — Z1231 Encounter for screening mammogram for malignant neoplasm of breast: Secondary | ICD-10-CM | POA: Diagnosis not present

## 2013-06-27 DIAGNOSIS — Z1212 Encounter for screening for malignant neoplasm of rectum: Secondary | ICD-10-CM | POA: Diagnosis not present

## 2013-06-27 DIAGNOSIS — Z124 Encounter for screening for malignant neoplasm of cervix: Secondary | ICD-10-CM | POA: Diagnosis not present

## 2013-08-29 ENCOUNTER — Ambulatory Visit (INDEPENDENT_AMBULATORY_CARE_PROVIDER_SITE_OTHER): Payer: Medicare Other | Admitting: Cardiology

## 2013-08-29 ENCOUNTER — Encounter: Payer: Self-pay | Admitting: Cardiology

## 2013-08-29 VITALS — BP 145/77 | HR 68 | Ht 64.0 in | Wt 163.0 lb

## 2013-08-29 DIAGNOSIS — I10 Essential (primary) hypertension: Secondary | ICD-10-CM

## 2013-08-29 DIAGNOSIS — I498 Other specified cardiac arrhythmias: Secondary | ICD-10-CM

## 2013-08-29 DIAGNOSIS — E785 Hyperlipidemia, unspecified: Secondary | ICD-10-CM | POA: Diagnosis not present

## 2013-08-29 DIAGNOSIS — I471 Supraventricular tachycardia: Secondary | ICD-10-CM

## 2013-08-29 NOTE — Assessment & Plan Note (Signed)
No complaints of palpitations a low-dose beta blocker. Had brief PSVT documented by prior monitoring. No changes made. Continue observation, annual followup for now.

## 2013-08-29 NOTE — Assessment & Plan Note (Signed)
Continues with followup per primary care. She is omega-3 supplements.

## 2013-08-29 NOTE — Progress Notes (Signed)
Clinical Summary Brandi [name] is a 73 y.o.female last seen in October 2014, stable on medical therapy at that point. She continues to do very well, no reported palpitations or chest pain. States active including outdoor chores.  Echocardiogram from May 2014 demonstrated LVEF 55-60% with grade 2 diastolic dysfunction, mild to moderate left atrial enlargement, atrial septal aneurysm, mild to moderate mitral regurgitation, PASP 35-40 mm mercury. Cardiac monitor done in July 2014 demonstrated sinus rhythm with occasional to frequent PVCs, brief episodes of PSVT, no pauses or atrial fibrillation.  Lab work from earlier in the year reviewed finding cholesterol 203, triglycerides 255, HDL 66, LDL 86, particle number as reviewed. Lipids are being managed by Dr. Jacelyn Grip.   Allergies  Allergen Reactions  . Sulfa Antibiotics Anaphylaxis  . Sudafed [Pseudoephedrine Hcl]     Current Outpatient Prescriptions  Medication Sig Dispense Refill  . aspirin 81 MG EC tablet Take 81 mg by mouth daily.        . Cholecalciferol (VITAMIN D) 2000 UNITS CAPS Take 2,000 Units by mouth daily.       Marland Kitchen dicyclomine (BENTYL) 10 MG capsule Take 10 mg by mouth 2 (two) times daily.       Marland Kitchen estrogens, conjugated, (PREMARIN) 0.625 MG tablet Take 0.625 mg by mouth daily.       . fish oil-omega-3 fatty acids 1000 MG capsule Take 1 g by mouth daily.       . hydrochlorothiazide (MICROZIDE) 12.5 MG capsule Take 1 capsule (12.5 mg total) by mouth daily.  90 capsule  3  . meclizine (ANTIVERT) 12.5 MG tablet Take 1 tablet (12.5 mg total) by mouth 3 (three) times daily as needed.  90 tablet  0  . metoprolol succinate (TOPROL-XL) 25 MG 24 hr tablet Take 0.5 tablets (12.5 mg total) by mouth daily.  45 tablet  3  . Multiple Vitamin (MULTIVITAMIN) tablet Take 1 tablet by mouth daily.       No current facility-administered medications for this visit.    Past Medical History  Diagnosis Date  . Diverticulosis   . Sinus bradycardia   .  Irritable bowel syndrome   . Essential hypertension, benign   . Mitral regurgitation     Mild to moderate 09/2012  . Mixed hyperlipidemia   . PSVT (paroxysmal supraventricular tachycardia)     Brief by monitoring    Social History Brandi [name] reports that she quit smoking about 15 years ago. Her smoking use included Cigarettes. She smoked 0.00 packs per day. She does not have any smokeless tobacco history on file. Brandi [name] reports that she does not drink alcohol.  Review of Systems Negative except as outlined.  Physical Examination Filed Vitals:   08/29/13 1109  BP: 145/77  Pulse: 68   Filed Weights   08/29/13 1109  Weight: 163 lb (73.936 kg)    Appears comfortable at rest.  HEENT: Conjunctiva and lids normal, oropharynx clear.  Neck: Supple, no elevated JVP or carotid bruits, no thyromegaly.  Lungs: Clear to auscultation, nonlabored breathing at rest.  Cardiac: Regular rate and rhythm, no S3 or significant systolic murmur, no pericardial rub.  Abdomen: Soft, nontender,bowel sounds present.  Extremities: No pitting edema, distal pulses 2+.    Problem List and Plan   SVT (supraventricular tachycardia) No complaints of palpitations a low-dose beta blocker. Had brief PSVT documented by prior monitoring. No changes made. Continue observation, annual followup for now.  Essential hypertension, benign Reviewed home blood pressure checks, systolics ranging 326Z  to 130s.  Hyperlipidemia Continues with followup per primary care. She is omega-3 supplements.    Brandi [name], M.D., F.A.C.C.

## 2013-08-29 NOTE — Patient Instructions (Signed)
Continue all current medications. Your physician wants you to follow up in:  1 year.  You will receive a reminder letter in the mail one-two months in advance.  If you don't receive a letter, please call our office to schedule the follow up appointment   

## 2013-08-29 NOTE — Assessment & Plan Note (Signed)
Reviewed home blood pressure checks, systolics ranging 022V to 130s.

## 2013-09-02 DIAGNOSIS — J301 Allergic rhinitis due to pollen: Secondary | ICD-10-CM | POA: Diagnosis not present

## 2013-09-08 ENCOUNTER — Encounter: Payer: Self-pay | Admitting: Family Medicine

## 2013-09-08 ENCOUNTER — Ambulatory Visit (INDEPENDENT_AMBULATORY_CARE_PROVIDER_SITE_OTHER): Payer: Medicare Other | Admitting: Family Medicine

## 2013-09-08 VITALS — BP 122/71 | HR 62 | Temp 97.2°F | Ht 64.0 in | Wt 159.2 lb

## 2013-09-08 DIAGNOSIS — I498 Other specified cardiac arrhythmias: Secondary | ICD-10-CM | POA: Diagnosis not present

## 2013-09-08 DIAGNOSIS — M129 Arthropathy, unspecified: Secondary | ICD-10-CM

## 2013-09-08 DIAGNOSIS — M1990 Unspecified osteoarthritis, unspecified site: Secondary | ICD-10-CM

## 2013-09-08 DIAGNOSIS — E785 Hyperlipidemia, unspecified: Secondary | ICD-10-CM | POA: Diagnosis not present

## 2013-09-08 DIAGNOSIS — I10 Essential (primary) hypertension: Secondary | ICD-10-CM

## 2013-09-08 DIAGNOSIS — I471 Supraventricular tachycardia, unspecified: Secondary | ICD-10-CM

## 2013-09-08 DIAGNOSIS — E559 Vitamin D deficiency, unspecified: Secondary | ICD-10-CM | POA: Diagnosis not present

## 2013-09-08 MED ORDER — DICLOFENAC SODIUM 2 % TD SOLN: 1.0000 "application " | Freq: Four times a day (QID) | TRANSDERMAL | Status: DC

## 2013-09-08 MED ORDER — MECLIZINE HCL 12.5 MG PO TABS: 12.5000 mg | ORAL_TABLET | Freq: Three times a day (TID) | ORAL | Status: AC | PRN

## 2013-09-08 NOTE — Progress Notes (Signed)
Patient ID: Brandi [name], female   DOB: [birth year], 73 y.o.   MRN: [id number] SUBJECTIVE: CC: Chief Complaint  Patient presents with  . Follow-up    3 MONTH FOLLOW UP CHRONIC PROBLEMS WANTS RX FOR TENDONSIITIS    HPI: Patient is here for follow up of hyperlipidemia/HTN/SVT: denies Headache;denies Chest Pain;denies weakness;denies Shortness of Breath and orthopnea;denies Visual changes;denies palpitations;denies cough;denies pedal edema;denies symptoms of TIA or stroke;deniesClaudication symptoms. admits to Compliance with medications; denies Problems with medications. As above. Needs the diclofenac solution/Pennsaid refilled. It worked.   Past Medical History  Diagnosis Date  . Diverticulosis   . Sinus bradycardia   . Irritable bowel syndrome   . Essential hypertension, benign   . Mitral regurgitation     Mild to moderate 09/2012  . Mixed hyperlipidemia   . PSVT (paroxysmal supraventricular tachycardia)     Brief by monitoring   Past Surgical History  Procedure Laterality Date  . Abdominal hysterectomy  06-02-2008  . Orif hip fracture     History   Social History  . Marital Status: Widowed    Spouse Name: N/A    Number of Children: N/A  . Years of Education: N/A   Occupational History  . Not on file.   Social History Main Topics  . Smoking status: Former Smoker    Types: Cigarettes    Quit date: 05/26/1998  . Smokeless tobacco: Not on file  . Alcohol Use: No  . Drug Use: No  . Sexual Activity: Not on file   Other Topics Concern  . Not on file   Social History Narrative   Widow   2 children   Family History  Problem Relation Age of Onset  . Stroke Mother   . Asthma Father   . COPD Father   . CAD Sister    Current Outpatient Prescriptions on File Prior to Visit  Medication Sig Dispense Refill  . aspirin 81 MG EC tablet Take 81 mg by mouth daily.        . Cholecalciferol (VITAMIN D) 2000 UNITS CAPS Take 2,000 Units by mouth daily.       Marland Kitchen estrogens,  conjugated, (PREMARIN) 0.625 MG tablet Take 0.625 mg by mouth daily.       . fish oil-omega-3 fatty acids 1000 MG capsule Take 1 g by mouth daily.       . hydrochlorothiazide (MICROZIDE) 12.5 MG capsule Take 1 capsule (12.5 mg total) by mouth daily.  90 capsule  3  . metoprolol succinate (TOPROL-XL) 25 MG 24 hr tablet Take 0.5 tablets (12.5 mg total) by mouth daily.  45 tablet  3  . Multiple Vitamin (MULTIVITAMIN) tablet Take 1 tablet by mouth daily.      Marland Kitchen dicyclomine (BENTYL) 10 MG capsule Take 10 mg by mouth 2 (two) times daily.        No current facility-administered medications on file prior to visit.   Allergies  Allergen Reactions  . Sulfa Antibiotics Anaphylaxis  . Sudafed [Pseudoephedrine Hcl]    Immunization History  Administered Date(s) Administered  . DTaP 05/02/2009  . Influenza Whole 02/19/2010, 02/24/2012  . Influenza,inj,Quad PF,36+ Mos 2020/09/2512  . Pneumococcal Conjugate-13 04/13/2012  . Tdap 04/25/2009  . Zoster 01/27/2013   Prior to Admission medications   Medication Sig Start Date End Date Taking? Authorizing Provider  aspirin 81 MG EC tablet Take 81 mg by mouth daily.     Yes Historical Provider, MD  Cholecalciferol (VITAMIN D) 2000 UNITS CAPS Take 2,000 Units by  mouth daily.    Yes Historical Provider, MD  estrogens, conjugated, (PREMARIN) 0.625 MG tablet Take 0.625 mg by mouth daily.    Yes Historical Provider, MD  fish oil-omega-3 fatty acids 1000 MG capsule Take 1 g by mouth daily.    Yes Historical Provider, MD  hydrochlorothiazide (MICROZIDE) 12.5 MG capsule Take 1 capsule (12.5 mg total) by mouth daily. 01/13/13  Yes Satira Sark, MD  meclizine (ANTIVERT) 12.5 MG tablet Take 1 tablet (12.5 mg total) by mouth 3 (three) times daily as needed. 08/24/12  Yes Vernie Shanks, MD  metoprolol succinate (TOPROL-XL) 25 MG 24 hr tablet Take 0.5 tablets (12.5 mg total) by mouth daily. 01/13/13  Yes Satira Sark, MD  Multiple Vitamin (MULTIVITAMIN) tablet Take 1  tablet by mouth daily.   Yes Historical Provider, MD  dicyclomine (BENTYL) 10 MG capsule Take 10 mg by mouth 2 (two) times daily.     Historical Provider, MD     ROS: As above in the HPI. All other systems are stable or negative.  OBJECTIVE: APPEARANCE:  Patient in no acute distress.The patient appeared well nourished and normally developed. Acyanotic. Waist: VITAL SIGNS:BP 122/71  Pulse 62  Temp(Src) 97.2 F (36.2 C) (Oral)  Ht _0  (1.626 m)  Wt 159 lb 3.2 oz (72.213 kg)  BMI 27.31 kg/m2 WF   SKIN: warm and  Dry without overt rashes, tattoos and scars  HEAD and Neck: without JVD, Head and scalp: normal Eyes:No scleral icterus. No changes from before. Ears: Auricle normal, canal normal, Tympanic membranes normal, insufflation normal. Nose: normal Throat: normal Neck & thyroid: normal  CHEST & LUNGS: Chest wall: normal Lungs: Clear  CVS: Reveals the PMI to be normally located. Regular rhythm, First and Second Heart sounds are normal,  absence of murmurs, rubs or gallops. Peripheral vasculature: Radial pulses: normal Dorsal pedis pulses: normal Posterior pulses: normal  ABDOMEN:  Appearance: normal Benign, no organomegaly, no masses, no Abdominal Aortic enlargement. No Guarding , no rebound. No Bruits. Bowel sounds: normal  RECTAL: N/A GU: N/A  EXTREMETIES: nonedematous.  MUSCULOSKELETAL:  Spine: normal Joints: intact  NEUROLOGIC: oriented to time,place and person; nonfocal. Strength is normal Sensory is normal Reflexes are normal Cranial Nerves are normal.  Results for orders placed in visit on 06/10/13  CMP14+EGFR      Result Value Ref Range   Glucose 81  65 - 99 mg/dL   BUN 10  8 - 27 mg/dL   Creatinine, Ser 0.74  0.57 - 1.00 mg/dL   GFR calc non Af Amer 80  >59 mL/min/1.73   GFR calc Af Amer 92  >59 mL/min/1.73   BUN/Creatinine Ratio 14  11 - 26   Sodium 141  134 - 144 mmol/L   Potassium 4.4  3.5 - 5.2 mmol/L   Chloride 99  97 - 108  mmol/L   CO2 24  18 - 29 mmol/L   Calcium 9.2  8.6 - 10.2 mg/dL   Total Protein 6.1  6.0 - 8.5 g/dL   Albumin 3.7  3.5 - 4.8 g/dL   Globulin, Total 2.4  1.5 - 4.5 g/dL   Albumin/Globulin Ratio 1.5  1.1 - 2.5   Total Bilirubin 0.2  0.0 - 1.2 mg/dL   Alkaline Phosphatase 64  39 - 117 IU/L   AST 20  0 - 40 IU/L   ALT 16  0 - 32 IU/L  NMR, LIPOPROFILE      Result Value Ref Range   LDL  Particle Number [id number] (*) <1000 nmol/L   LDLC SERPL CALC-MCNC 86  <100 mg/dL   HDL Cholesterol by NMR 66  >=40 mg/dL   Triglycerides by NMR 255 (*) <150 mg/dL   Cholesterol 203 (*) <200 mg/dL   HDL Particle Number [id number]  >=30.5 umol/L   Small LDL Particle Number [id number] (*) <=527 nmol/L   LDL Size 20.1 (*) >20.5 nm   LP-IR Score 47 (*) <=45  VITAMIN D 25 HYDROXY      Result Value Ref Range   Vit D, 25-Hydroxy 29.1 (*) 30.0 - 100.0 ng/mL    ASSESSMENT:  Hyperlipidemia - Plan: CMP14+EGFR, NMR, lipoprofile  Essential hypertension, benign - Plan: CMP14+EGFR  Vitamin D deficiency - Plan: Vit D  25 hydroxy (rtn osteoporosis monitoring)  SVT (supraventricular tachycardia) - Plan: TSH  Arthritis - Plan: Diclofenac Sodium 2 % SOLN Stable doing well.  PLAN: Plant based  Diet. Keep active Orders Placed This Encounter  Procedures  . CMP14+EGFR  . NMR, lipoprofile  . Vit D  25 hydroxy (rtn osteoporosis monitoring)  . TSH   Meds ordered this encounter  Medications  . Diclofenac Sodium 2 % SOLN    Sig: Place 1 application onto the skin QID.    Dispense:  112 g    Refill:  2  . meclizine (ANTIVERT) 12.5 MG tablet    Sig: Take 1 tablet (12.5 mg total) by mouth 3 (three) times daily as needed.    Dispense:  90 tablet    Refill:  1   Medications Discontinued During This Encounter  Medication Reason  . meclizine (ANTIVERT) 12.5 MG tablet Reorder   Return in about 4 months (around 01/08/2014) for Recheck medical problems.  Horald Birky P. Jacelyn Grip, M.D.

## 2013-09-09 LAB — CMP14+EGFR
ALT: 20 IU/L (ref 0–32)
AST: 19 IU/L (ref 0–40)
Albumin/Globulin Ratio: 1.5 (ref 1.1–2.5)
Albumin: 4.1 g/dL (ref 3.5–4.8)
Alkaline Phosphatase: 73 IU/L (ref 39–117)
BUN/Creatinine Ratio: 17 (ref 11–26)
BUN: 15 mg/dL (ref 8–27)
CO2: 23 mmol/L (ref 18–29)
Calcium: 9.8 mg/dL (ref 8.7–10.3)
Chloride: 97 mmol/L (ref 97–108)
Creatinine, Ser: 0.87 mg/dL (ref 0.57–1.00)
GFR calc Af Amer: 76 mL/min/{1.73_m2} (ref >59)
GFR calc non Af Amer: 66 mL/min/{1.73_m2} (ref >59)
Globulin, Total: 2.7 g/dL (ref 1.5–4.5)
Glucose: 93 mg/dL (ref 65–99)
Potassium: 4.4 mmol/L (ref 3.5–5.2)
Sodium: 138 mmol/L (ref 134–144)
Total Bilirubin: 0.3 mg/dL (ref 0.0–1.2)
Total Protein: 6.8 g/dL (ref 6.0–8.5)

## 2013-09-09 LAB — NMR, LIPOPROFILE
Cholesterol: 228 mg/dL — ABNORMAL HIGH (ref <200)
HDL Cholesterol by NMR: 71 mg/dL (ref >=40)
HDL Particle Number: 52 umol/L (ref >=30.5)
LDL Particle Number: 1826 nmol/L — ABNORMAL HIGH (ref <1000)
LDL Size: 20.8 nm (ref >20.5)
LDLC SERPL CALC-MCNC: 113 mg/dL — ABNORMAL HIGH (ref <100)
LP-IR Score: 43 (ref <=45)
Small LDL Particle Number: 946 nmol/L — ABNORMAL HIGH (ref <=527)
Triglycerides by NMR: 218 mg/dL — ABNORMAL HIGH (ref <150)

## 2013-09-09 LAB — TSH: TSH: 1.81 u[IU]/mL (ref 0.450–4.500)

## 2013-09-09 LAB — VITAMIN D 25 HYDROXY (VIT D DEFICIENCY, FRACTURES): Vit D, 25-Hydroxy: 31.2 ng/mL (ref 30.0–100.0)

## 2013-09-11 ENCOUNTER — Other Ambulatory Visit: Payer: Self-pay | Admitting: Family Medicine

## 2013-09-11 MED ORDER — PRAVASTATIN SODIUM 20 MG PO TABS: 20.0000 mg | ORAL_TABLET | Freq: Every day | ORAL | Status: AC

## 2013-10-14 DIAGNOSIS — J019 Acute sinusitis, unspecified: Secondary | ICD-10-CM | POA: Diagnosis not present

## 2013-10-19 DIAGNOSIS — L570 Actinic keratosis: Secondary | ICD-10-CM | POA: Diagnosis not present

## 2013-10-19 DIAGNOSIS — D2360 Other benign neoplasm of skin of unspecified upper limb, including shoulder: Secondary | ICD-10-CM | POA: Diagnosis not present

## 2013-10-24 DIAGNOSIS — I639 Cerebral infarction, unspecified: Secondary | ICD-10-CM

## 2013-10-24 HISTORY — DX: Cerebral infarction, unspecified: I63.9

## 2013-10-26 ENCOUNTER — Ambulatory Visit (INDEPENDENT_AMBULATORY_CARE_PROVIDER_SITE_OTHER): Payer: Medicare Other | Admitting: Family

## 2013-10-26 ENCOUNTER — Ambulatory Visit (INDEPENDENT_AMBULATORY_CARE_PROVIDER_SITE_OTHER): Payer: Medicare Other

## 2013-10-26 ENCOUNTER — Encounter: Payer: Self-pay | Admitting: Family

## 2013-10-26 VITALS — BP 148/76 | HR 71 | Temp 96.1°F | Ht 64.0 in | Wt 162.4 lb

## 2013-10-26 DIAGNOSIS — R059 Cough, unspecified: Secondary | ICD-10-CM

## 2013-10-26 DIAGNOSIS — R05 Cough: Secondary | ICD-10-CM

## 2013-10-26 DIAGNOSIS — J329 Chronic sinusitis, unspecified: Secondary | ICD-10-CM | POA: Diagnosis not present

## 2013-10-26 MED ORDER — BENZONATATE 200 MG PO CAPS: 200.0000 mg | ORAL_CAPSULE | Freq: Three times a day (TID) | ORAL | Status: DC | PRN

## 2013-10-26 MED ORDER — AMOXICILLIN-POT CLAVULANATE 875-125 MG PO TABS: 1.0000 | ORAL_TABLET | Freq: Two times a day (BID) | ORAL | Status: DC

## 2013-10-26 NOTE — Progress Notes (Signed)
Subjective:    Patient ID: Brandi [name], female    DOB: [birth year], 73 y.o.   MRN: [id number]  Cough This is a new problem. The current episode started 1 to 4 weeks ago (Two weeks ago). The problem has been waxing and waning. The problem occurs every few minutes. The cough is productive of purulent sputum. Associated symptoms include ear congestion, nasal congestion, postnasal drip, rhinorrhea and wheezing. Pertinent negatives include no chills, ear pain, fever, sore throat or shortness of breath. The symptoms are aggravated by exercise. She has tried rest (Claritin and flonase) for the symptoms. The treatment provided moderate relief. There is no history of asthma or COPD.      Review of Systems  Constitutional: Negative for fever and chills.  HENT: Positive for postnasal drip and rhinorrhea. Negative for ear pain and sore throat.   Respiratory: Positive for cough and wheezing. Negative for shortness of breath.   Gastrointestinal: Negative.   Genitourinary: Negative.   Musculoskeletal: Negative.   Neurological: Negative.   Hematological: Negative.   All other systems reviewed and are negative.      Objective:   Physical Exam  Vitals reviewed. Constitutional: She is oriented to person, place, and time. She appears well-developed and well-nourished. No distress.  HENT:  Head: Normocephalic and atraumatic.  Right Ear: External ear normal.  Left Ear: External ear normal.  Nasal passage erythemas with mild swelling, oropharynx erythemas   Cardiovascular: Normal rate, regular rhythm, normal heart sounds and intact distal pulses.   No murmur heard. Pulmonary/Chest: Effort normal and breath sounds normal. No respiratory distress. She has no wheezes.  Constant dry cough   Abdominal: Soft. Bowel sounds are normal. She exhibits no distension. There is no tenderness.  Musculoskeletal: Normal range of motion. She exhibits no edema and no tenderness.  Neurological: She is alert and  oriented to person, place, and time.  Skin: Skin is warm and dry.  Psychiatric: She has a normal mood and affect. Her behavior is normal. Judgment and thought content normal.    X-ray WNL-Preliminary reading by Evelina Dun, FNP [facility]   BP 148/76  Pulse 71  Temp(Src) 96.1 F (35.6 C) (Oral)  Ht 5\' 4"  (1.626 m)  Wt 162 lb 6.4 oz (73.664 kg)  BMI 27.86 kg/m2  SpO2 98%     Assessment & Plan:  1. Cough - DG Chest 2 View; Future  2. Sinusitis - Take meds as prescribed - Use a cool mist humidifier  -Use saline nose sprays frequently -Saline irrigations of the nose can be very helpful if done frequently.  * 4X daily for 1 week*  * Use of a nettie pot can be helpful with this. Follow directions with this* -Force fluids -For any cough or congestion  Use plain Mucinex- regular strength or max strength is fine   * Children- consult with Pharmacist for dosing -For fever or aces or pains- take tylenol or ibuprofen appropriate for age and weight.  * for fevers greater than 101 orally you may alternate ibuprofen and tylenol every  3 hours. -Throat lozenges if help Meds ordered this encounter  Medications  . amoxicillin-clavulanate (AUGMENTIN) 875-125 MG per tablet    Sig: Take 1 tablet by mouth 2 (two) times daily.    Dispense:  14 tablet    Refill:  0    Order Specific Question:  Supervising Provider    Answer:  Chipper Herb [[id number]]  . benzonatate (TESSALON) 200 MG capsule    Sig: Take  1 capsule (200 mg total) by mouth 3 (three) times daily as needed for cough.    Dispense:  30 capsule    Refill:  2    Order Specific Question:  Supervising Provider    Answer:  Chipper Herb [[id number]]     Evelina Dun, FNP

## 2013-10-26 NOTE — Patient Instructions (Signed)

## 2013-11-03 ENCOUNTER — Other Ambulatory Visit: Payer: Self-pay | Admitting: Dermatology

## 2013-11-03 DIAGNOSIS — D485 Neoplasm of uncertain behavior of skin: Secondary | ICD-10-CM | POA: Diagnosis not present

## 2013-11-03 DIAGNOSIS — B079 Viral wart, unspecified: Secondary | ICD-10-CM | POA: Diagnosis not present

## 2013-12-02 DIAGNOSIS — E782 Mixed hyperlipidemia: Secondary | ICD-10-CM | POA: Diagnosis not present

## 2013-12-02 DIAGNOSIS — I10 Essential (primary) hypertension: Secondary | ICD-10-CM | POA: Diagnosis not present

## 2014-03-03 DIAGNOSIS — I10 Essential (primary) hypertension: Secondary | ICD-10-CM | POA: Diagnosis not present

## 2014-03-03 DIAGNOSIS — E784 Other hyperlipidemia: Secondary | ICD-10-CM | POA: Diagnosis not present

## 2014-03-23 ENCOUNTER — Ambulatory Visit (INDEPENDENT_AMBULATORY_CARE_PROVIDER_SITE_OTHER): Payer: Medicare Other

## 2014-03-23 ENCOUNTER — Encounter (INDEPENDENT_AMBULATORY_CARE_PROVIDER_SITE_OTHER): Payer: Self-pay

## 2014-03-23 DIAGNOSIS — Z23 Encounter for immunization: Secondary | ICD-10-CM | POA: Diagnosis not present

## 2014-04-06 ENCOUNTER — Other Ambulatory Visit: Payer: Self-pay | Admitting: *Deleted

## 2014-04-06 MED ORDER — METOPROLOL SUCCINATE ER 25 MG PO TB24: 12.5000 mg | ORAL_TABLET | Freq: Every day | ORAL | Status: DC

## 2014-06-05 DIAGNOSIS — Z0000 Encounter for general adult medical examination without abnormal findings: Secondary | ICD-10-CM | POA: Diagnosis not present

## 2014-06-05 DIAGNOSIS — Z1389 Encounter for screening for other disorder: Secondary | ICD-10-CM | POA: Diagnosis not present

## 2014-06-05 DIAGNOSIS — E784 Other hyperlipidemia: Secondary | ICD-10-CM | POA: Diagnosis not present

## 2014-06-05 DIAGNOSIS — I10 Essential (primary) hypertension: Secondary | ICD-10-CM | POA: Diagnosis not present

## 2014-07-11 DIAGNOSIS — Z6826 Body mass index (BMI) 26.0-26.9, adult: Secondary | ICD-10-CM | POA: Diagnosis not present

## 2014-07-11 DIAGNOSIS — Z01419 Encounter for gynecological examination (general) (routine) without abnormal findings: Secondary | ICD-10-CM | POA: Diagnosis not present

## 2014-07-11 DIAGNOSIS — Z1231 Encounter for screening mammogram for malignant neoplasm of breast: Secondary | ICD-10-CM | POA: Diagnosis not present

## 2014-08-29 ENCOUNTER — Ambulatory Visit (INDEPENDENT_AMBULATORY_CARE_PROVIDER_SITE_OTHER): Payer: Medicare Other | Admitting: Cardiology

## 2014-08-29 ENCOUNTER — Encounter: Payer: Self-pay | Admitting: Cardiology

## 2014-08-29 VITALS — BP 138/78 | HR 65 | Ht 64.0 in | Wt 154.0 lb

## 2014-08-29 DIAGNOSIS — I10 Essential (primary) hypertension: Secondary | ICD-10-CM | POA: Diagnosis not present

## 2014-08-29 DIAGNOSIS — I340 Nonrheumatic mitral (valve) insufficiency: Secondary | ICD-10-CM

## 2014-08-29 DIAGNOSIS — I471 Supraventricular tachycardia: Secondary | ICD-10-CM

## 2014-08-29 NOTE — Progress Notes (Signed)
Cardiology Office Note  Date: 08/29/2014   ID: Abiageal, Blowe Dec 17, 1940, MRN [id number]  PCP: Neale Burly, MD  Primary Cardiologist: Rozann Lesches, MD   Chief Complaint  Patient presents with  . Mitral valve disease  . History of PSVT    History of Present Illness: Brandi [name] is a 74 y.o. female last seen in April 2015. She presents for a routine follow-up visit. Since last year she has not had any significant palpitations, denies any exertional chest pain or unusual shortness of breath with her regular activities. She remains functional in her ADLs including housework and outdoor work.  Follow-up ECG is noted below, normal sinus rhythm noted. She continues on low-dose Toprol-XL.  Her last echocardiogram was in 2014.   Past Medical History  Diagnosis Date  . Diverticulosis   . Sinus bradycardia   . Irritable bowel syndrome   . Essential hypertension, benign   . Mitral regurgitation     Mild to moderate 09/2012  . Mixed hyperlipidemia   . PSVT (paroxysmal supraventricular tachycardia)     Brief by monitoring     Current Outpatient Prescriptions  Medication Sig Dispense Refill  . amoxicillin-clavulanate (AUGMENTIN) 875-125 MG per tablet Take 1 tablet by mouth 2 (two) times daily. 14 tablet 0  . aspirin 81 MG EC tablet Take 81 mg by mouth daily.      . benzonatate (TESSALON) 200 MG capsule Take 1 capsule (200 mg total) by mouth 3 (three) times daily as needed for cough. 30 capsule 2  . Cholecalciferol (VITAMIN D) 2000 UNITS CAPS Take 2,000 Units by mouth daily.     . Diclofenac Sodium 2 % SOLN Place 1 application onto the skin QID. 112 g 2  . dicyclomine (BENTYL) 10 MG capsule Take 10 mg by mouth 2 (two) times daily.     Marland Kitchen estrogens, conjugated, (PREMARIN) 0.625 MG tablet Take 0.3 mg by mouth daily.     . fish oil-omega-3 fatty acids 1000 MG capsule Take 1 g by mouth daily.     . hydrochlorothiazide (MICROZIDE) 12.5 MG capsule Take 1 capsule (12.5 mg total)  by mouth daily. 90 capsule 3  . meclizine (ANTIVERT) 12.5 MG tablet Take 1 tablet (12.5 mg total) by mouth 3 (three) times daily as needed. 90 tablet 1  . metoprolol succinate (TOPROL-XL) 25 MG 24 hr tablet Take 0.5 tablets (12.5 mg total) by mouth daily. 45 tablet 3  . Multiple Vitamin (MULTIVITAMIN) tablet Take 1 tablet by mouth daily.    . pravastatin (PRAVACHOL) 20 MG tablet Take 1 tablet (20 mg total) by mouth daily. 30 tablet 5   No current facility-administered medications for this visit.    Allergies:  Sulfa antibiotics and Sudafed   Social History: The patient  reports that she quit smoking about 16 years ago. Her smoking use included Cigarettes. She has never used smokeless tobacco. She reports that she does not drink alcohol or use illicit drugs.   ROS:  Please see the history of present illness. Otherwise, complete review of systems is positive for none.  All other systems are reviewed and negative.   Physical Exam: VS:  BP 138/78 mmHg  Pulse 65  Ht 5\' 4"  (1.626 m)  Wt 154 lb (69.854 kg)  BMI 26.42 kg/m2  SpO2 97%, BMI Body mass index is 26.42 kg/(m^2).  Wt Readings from Last 3 Encounters:  08/29/14 154 lb (69.854 kg)  10/26/13 162 lb 6.4 oz (73.664 kg)  09/08/13 159 lb  3.2 oz (72.213 kg)     Appears comfortable at rest.  HEENT: Conjunctiva and lids normal, oropharynx clear.  Neck: Supple, no elevated JVP or carotid bruits, no thyromegaly.  Lungs: Clear to auscultation, nonlabored breathing at rest.  Cardiac: Regular rate and rhythm, no S3 or significant systolic murmur, no pericardial rub.  Abdomen: Soft, nontender,bowel sounds present.  Extremities: No pitting edema, distal pulses 2+.   ECG: ECG is ordered today and reviewed showing normal sinus rhythm with borderline low voltage.   Recent Labwork: 09/08/2013: ALT 20; AST 19; BUN 15; Creatinine 0.87; Potassium 4.4; Sodium 138; TSH 1.810     Component Value Date/Time   CHOL 228* 09/08/2013 1147   CHOL  154 08/24/2012 1212   TRIG 218* 09/08/2013 1147   TRIG 131 08/24/2012 1212   HDL 71 09/08/2013 1147   HDL 68 08/24/2012 1212   LDLCALC 113* 09/08/2013 1147   LDLCALC 60 08/24/2012 1212    Other Studies Reviewed Today:  1. Echocardiogram from May 2014 demonstrated LVEF 55-60% with grade 2 diastolic dysfunction, mild to moderate left atrial enlargement, atrial septal aneurysm, mild to moderate mitral regurgitation, PASP 35-40 mm mercury.  2. Cardiac monitor done in July 2014 demonstrated sinus rhythm with occasional to frequent PVCs, brief episodes of PSVT, no pauses or atrial fibrillation.  Assessment and Plan:  1. Symptomatically stable with history of PSVT, well controlled on low-dose beta blocker. Continue observation. She is in normal sinus rhythm today.  2. Previously documented mitral regurgitation, mild to moderate as of 2014. No change in symptoms or examination.  Current medicines were reviewed with the patient today. No changes were made.   Orders Placed This Encounter  Procedures  . EKG 12-Lead    Disposition: FU with me in 1 year.   Signed, Satira Sark, MD, [facility] 08/29/2014 10:26 AM    [place] [facility] [place], [place], [place] [address] Phone: [phone]; Fax: [phone]

## 2014-08-29 NOTE — Patient Instructions (Signed)

## 2014-09-04 DIAGNOSIS — E784 Other hyperlipidemia: Secondary | ICD-10-CM | POA: Diagnosis not present

## 2014-09-04 DIAGNOSIS — I10 Essential (primary) hypertension: Secondary | ICD-10-CM | POA: Diagnosis not present

## 2014-09-15 DIAGNOSIS — H43813 Vitreous degeneration, bilateral: Secondary | ICD-10-CM | POA: Diagnosis not present

## 2014-09-15 DIAGNOSIS — H04123 Dry eye syndrome of bilateral lacrimal glands: Secondary | ICD-10-CM | POA: Diagnosis not present

## 2014-09-15 DIAGNOSIS — Z961 Presence of intraocular lens: Secondary | ICD-10-CM | POA: Diagnosis not present

## 2014-10-26 ENCOUNTER — Encounter: Payer: Self-pay | Admitting: Physician Assistant

## 2014-10-26 ENCOUNTER — Ambulatory Visit (INDEPENDENT_AMBULATORY_CARE_PROVIDER_SITE_OTHER): Payer: Medicare Other | Admitting: Physician Assistant

## 2014-10-26 ENCOUNTER — Ambulatory Visit (INDEPENDENT_AMBULATORY_CARE_PROVIDER_SITE_OTHER): Payer: Medicare Other

## 2014-10-26 VITALS — BP 138/70 | HR 75 | Temp 97.4°F | Ht 64.0 in | Wt 163.0 lb

## 2014-10-26 DIAGNOSIS — R059 Cough, unspecified: Secondary | ICD-10-CM

## 2014-10-26 DIAGNOSIS — J209 Acute bronchitis, unspecified: Secondary | ICD-10-CM | POA: Diagnosis not present

## 2014-10-26 DIAGNOSIS — R05 Cough: Secondary | ICD-10-CM

## 2014-10-26 DIAGNOSIS — J020 Streptococcal pharyngitis: Secondary | ICD-10-CM

## 2014-10-26 LAB — POCT RAPID STREP A (OFFICE): RAPID STREP A SCREEN: NEGATIVE

## 2014-10-26 LAB — POCT INFLUENZA A/B
Influenza A, POC: NEGATIVE
Influenza B, POC: NEGATIVE

## 2014-10-26 MED ORDER — BENZONATATE 200 MG PO CAPS: 200.0000 mg | ORAL_CAPSULE | Freq: Three times a day (TID) | ORAL | Status: DC | PRN

## 2014-10-26 MED ORDER — ALBUTEROL SULFATE HFA 108 (90 BASE) MCG/ACT IN AERS: 2.0000 | INHALATION_SPRAY | Freq: Four times a day (QID) | RESPIRATORY_TRACT | Status: DC | PRN

## 2014-10-26 MED ORDER — AZITHROMYCIN 250 MG PO TABS: ORAL_TABLET | ORAL | Status: DC

## 2014-10-26 NOTE — Progress Notes (Signed)
   Subjective:    Patient ID: Brandi [name], female    DOB: November 26, 1940, 74 y.o.   MRN: [id number]  HPI 74 y/o female presents with c/o cough x 3 weeks. She states that the cough is worse at night. Has tried otc alka seltzer plus cold med, delsym cough syrup and Musinex DM with no relief. No associated sick contacts.     Review of Systems  Constitutional: Negative for fever, chills, diaphoresis and fatigue.  HENT: Positive for congestion (chest ), rhinorrhea and sore throat.   Respiratory: Positive for cough (productive, worse at night ) and wheezing. Negative for shortness of breath.   Cardiovascular: Negative.   All other systems reviewed and are negative.      Objective:   Physical Exam  Constitutional: She is oriented to person, place, and time. She appears well-developed and well-nourished. No distress.  Cardiovascular: Normal rate, regular rhythm and normal heart sounds.  Exam reveals no gallop and no friction rub.   No murmur heard. Pulmonary/Chest: Effort normal. She has rales (generalized on posterior BS, worse on RUL).  Neurological: She is alert and oriented to person, place, and time.  Skin: She is not diaphoretic.  Psychiatric: She has a normal [name] and affect. Her behavior is normal. Judgment and thought content normal.  Nursing note and vitals reviewed.         Assessment & Plan:   Results for orders placed or performed in visit on 10/26/14  POCT Influenza A/B  Result Value Ref Range   Influenza A, POC Negative    Influenza B, POC Negative   POCT rapid strep A  Result Value Ref Range   Rapid Strep A Screen Negative Negative   1. Cough  - POCT Influenza A/B - DG Chest 2 View - azithromycin (ZITHROMAX) 250 MG tablet; 2 pills on day 1. 1 pill on day 2-5  Dispense: 6 tablet; Refill: 0 - albuterol (PROVENTIL HFA;VENTOLIN HFA) 108 (90 BASE) MCG/ACT inhaler; Inhale 2 puffs into the lungs every 6 (six) hours as needed for wheezing or shortness of breath.   Dispense: 1 Inhaler; Refill: 0 - benzonatate (TESSALON) 200 MG capsule; Take 1 capsule (200 mg total) by mouth 3 (three) times daily as needed for cough.  Dispense: 20 capsule; Refill: 0  2. Streptococcal sore throat  - POCT rapid strep A - Culture, Group A Strep  3. Acute bronchitis, unspecified organism  - azithromycin (ZITHROMAX) 250 MG tablet; 2 pills on day 1. 1 pill on day 2-5  Dispense: 6 tablet; Refill: 0 - albuterol (PROVENTIL HFA;VENTOLIN HFA) 108 (90 BASE) MCG/ACT inhaler; Inhale 2 puffs into the lungs every 6 (six) hours as needed for wheezing or shortness of breath.  Dispense: 1 Inhaler; Refill: 0 - benzonatate (TESSALON) 200 MG capsule; Take 1 capsule (200 mg total) by mouth 3 (three) times daily as needed for cough.  Dispense: 20 capsule; Refill: 0  otc plain mucinex    Continue all meds Labs pending Health Maintenance reviewed Diet and exercise encouraged RTO 2  Kasidy Gianino A. Benjamin Stain PA-C

## 2014-10-26 NOTE — Patient Instructions (Signed)
Use inhaler every 6 hours Over the counter PLAIN musinex Drink plenty of water

## 2014-10-29 LAB — CULTURE, GROUP A STREP: Strep A Culture: NEGATIVE

## 2014-11-14 ENCOUNTER — Encounter: Payer: Self-pay | Admitting: Physician Assistant

## 2014-11-14 ENCOUNTER — Ambulatory Visit (INDEPENDENT_AMBULATORY_CARE_PROVIDER_SITE_OTHER): Payer: Medicare Other | Admitting: Physician Assistant

## 2014-11-14 VITALS — BP 124/64 | HR 62 | Temp 97.2°F | Ht 64.0 in | Wt 163.0 lb

## 2014-11-14 DIAGNOSIS — R05 Cough: Secondary | ICD-10-CM | POA: Diagnosis not present

## 2014-11-14 DIAGNOSIS — J209 Acute bronchitis, unspecified: Secondary | ICD-10-CM

## 2014-11-14 DIAGNOSIS — R059 Cough, unspecified: Secondary | ICD-10-CM

## 2014-11-14 MED ORDER — BENZONATATE 200 MG PO CAPS: 200.0000 mg | ORAL_CAPSULE | Freq: Three times a day (TID) | ORAL | Status: DC | PRN

## 2014-11-14 MED ORDER — PREDNISONE 10 MG (21) PO TBPK: ORAL_TABLET | ORAL | Status: DC

## 2014-11-14 NOTE — Patient Instructions (Signed)
Continue plain musinex - drink plenty of fluids May want to add zyrtec 10mg  once daily

## 2014-11-14 NOTE — Progress Notes (Signed)
   Subjective:    Patient ID: Brandi [name], female    DOB: 1940/12/29, 74 y.o.   MRN: [id number]  HPI 74 y/o female presents for f/u of acute bronchitis s/p treatment with azithromycin x 5 days, albuterol inhaler ( has not used the past two days but q 6 hrs prior days) , plain musinex as directed and tessalon pearls. She states that she is feeling much better, however still has a mildly productive cough.     Review of Systems  Respiratory: Positive for cough (intermittently productive, worse with talking. ). Negative for chest tightness, shortness of breath and wheezing.   All other systems reviewed and are negative.      Objective:   Physical Exam  Constitutional: She is oriented to person, place, and time. She appears well-developed and well-nourished.  HENT:  Head: Normocephalic.  Cardiovascular: Normal rate, regular rhythm and normal heart sounds.  Exam reveals no gallop and no friction rub.   No murmur heard. Pulmonary/Chest: Effort normal. She has no wheezes. She has rales (mild in RUL posterior BS ). She exhibits no tenderness.  Neurological: She is alert and oriented to person, place, and time.  Psychiatric: She has a normal mood and affect. Her behavior is normal. Judgment and thought content normal.  Nursing note and vitals reviewed.         Assessment & Plan:  1. Cough  - benzonatate (TESSALON) 200 MG capsule; Take 1 capsule (200 mg total) by mouth 3 (three) times daily as needed for cough.  Dispense: 20 capsule; Refill: 0 - predniSONE (STERAPRED UNI-PAK 21 TAB) 10 MG (21) TBPK tablet; 6 pills PO on day 1, 5 on day 2, 4 on day 3, 3 on day 4, 2 on day 5, 1 on day 6  Dispense: 21 tablet; Refill: 0  2. Acute bronchitis, unspecified organism  - benzonatate (TESSALON) 200 MG capsule; Take 1 capsule (200 mg total) by mouth 3 (three) times daily as needed for cough.  Dispense: 20 capsule; Refill: 0 - predniSONE (STERAPRED UNI-PAK 21 TAB) 10 MG (21) TBPK tablet; 6 pills PO  on day 1, 5 on day 2, 4 on day 3, 3 on day 4, 2 on day 5, 1 on day 6  Dispense: 21 tablet; Refill: 0   RTO prn   Sye Schroepfer A. Benjamin Stain PA-C

## 2014-12-14 DIAGNOSIS — E784 Other hyperlipidemia: Secondary | ICD-10-CM | POA: Diagnosis not present

## 2014-12-14 DIAGNOSIS — I10 Essential (primary) hypertension: Secondary | ICD-10-CM | POA: Diagnosis not present

## 2014-12-14 DIAGNOSIS — G459 Transient cerebral ischemic attack, unspecified: Secondary | ICD-10-CM | POA: Diagnosis not present

## 2014-12-20 DIAGNOSIS — I340 Nonrheumatic mitral (valve) insufficiency: Secondary | ICD-10-CM | POA: Diagnosis not present

## 2014-12-20 DIAGNOSIS — I517 Cardiomegaly: Secondary | ICD-10-CM | POA: Diagnosis not present

## 2014-12-20 DIAGNOSIS — E784 Other hyperlipidemia: Secondary | ICD-10-CM | POA: Diagnosis not present

## 2014-12-20 DIAGNOSIS — R079 Chest pain, unspecified: Secondary | ICD-10-CM | POA: Diagnosis not present

## 2014-12-20 DIAGNOSIS — I700 Atherosclerosis of aorta: Secondary | ICD-10-CM | POA: Diagnosis not present

## 2014-12-20 DIAGNOSIS — R29818 Other symptoms and signs involving the nervous system: Secondary | ICD-10-CM | POA: Diagnosis not present

## 2015-03-16 DIAGNOSIS — E784 Other hyperlipidemia: Secondary | ICD-10-CM | POA: Diagnosis not present

## 2015-03-16 DIAGNOSIS — I10 Essential (primary) hypertension: Secondary | ICD-10-CM | POA: Diagnosis not present

## 2015-03-16 DIAGNOSIS — F5101 Primary insomnia: Secondary | ICD-10-CM | POA: Diagnosis not present

## 2015-04-10 ENCOUNTER — Ambulatory Visit (INDEPENDENT_AMBULATORY_CARE_PROVIDER_SITE_OTHER): Payer: Medicare Other

## 2015-04-10 DIAGNOSIS — Z23 Encounter for immunization: Secondary | ICD-10-CM

## 2015-04-16 ENCOUNTER — Other Ambulatory Visit: Payer: Self-pay | Admitting: Cardiology

## 2015-05-30 ENCOUNTER — Encounter: Payer: Self-pay | Admitting: Family Medicine

## 2015-05-30 ENCOUNTER — Ambulatory Visit (INDEPENDENT_AMBULATORY_CARE_PROVIDER_SITE_OTHER): Payer: Medicare Other | Admitting: Family Medicine

## 2015-05-30 VITALS — BP 132/69 | HR 60 | Temp 97.2°F | Ht 64.0 in | Wt 151.4 lb

## 2015-05-30 DIAGNOSIS — M7711 Lateral epicondylitis, right elbow: Secondary | ICD-10-CM

## 2015-05-30 DIAGNOSIS — Z23 Encounter for immunization: Secondary | ICD-10-CM | POA: Diagnosis not present

## 2015-05-30 DIAGNOSIS — Z0000 Encounter for general adult medical examination without abnormal findings: Secondary | ICD-10-CM

## 2015-05-30 MED ORDER — PNEUMOCOCCAL 13-VAL CONJ VACC IM SUSP: 0.5000 mL | INTRAMUSCULAR | Status: DC

## 2015-05-30 NOTE — Addendum Note (Signed)
Addended by: Nigel Berthold C on: 05/30/2015 10:48 AM   Modules accepted: Orders

## 2015-05-30 NOTE — Addendum Note (Signed)
Addended by: Nigel Berthold C on: 05/30/2015 10:52 AM   Modules accepted: Orders

## 2015-05-30 NOTE — Progress Notes (Signed)
   HPI  Patient presents today here today to discuss elbow pain.  Patient explains that she's had right lateral elbow pain for about 6 weeks. Started after she began making candies and fruit cakes for the holidays.  She's tried Tylenol, ibuprofen, and frequently icing the elbow with not much improvement. It hurts to extend her wrist and to grip things.  She's had similar pain previously but never had an injection in the elbow.   PMH: Smoking status noted ROS: Per HPI  Objective: BP 132/69 mmHg  Pulse 60  Temp(Src) 97.2 F (36.2 C) (Oral)  Ht 5\' 4"  (1.626 m)  Wt 151 lb 6.4 oz (68.675 kg)  BMI 25.98 kg/m2 Gen: NAD, alert, cooperative with exam HEENT: NCAT CV: RRR, good S1/S2, no murmur Resp: CTABL, no wheezes, non-labored Ext: No edema, warm Neuro: Alert and oriented, No gross deficits  Musculoskeletal: Right lateral epicondyles tenderness to palpation, also pain with grip and with extension against force Full range of motion of hand and elbow, no gross deformity, swelling, or redness of the right elbow   Right lateral epicondyle injection Informed consent was discussed and the patient wanted to proceed with the procedure. The area was cleaned with Betadine 2 and white clear with alcohol swab.: Spray was used to provide anesthesia of the skin and using a 22/1-1/2-gauge needle 1 mL of 80 mg/mL Depo-Medrol and 1 mL of 2% Marcaine were injected at the point of greatest tenderness in the area of the right lateral epicondyle. With withdrawal of the needle a small amount of bleeding occurred which stopped after 2 minutes of persistent pressure To ensure no additional bleeding a white compression bandage was applied using Coban with instructions to wear for 1-2 hours.  The patient tolerated the procedure easily  Assessment and plan:  # Tennis elbow Injected today with Depo-Medrol per patient's request. Continue ice, Tylenol, minimize Advil. Given rehabilitation exercises  from the sports medicine patient advisor Follow-up as needed  Healthcare maintenance DEXA and mammogram as per GYN, asked that she has the records sent to [place]. Prevnar given today   Laroy Apple, MD [facility] 05/30/2015, 9:16 AM

## 2015-05-30 NOTE — Patient Instructions (Addendum)
Great to see you!  Continue the ice and tylenol as needed  Hopefully you will see very good improvemnt tomorrow  See the handout about tennis elbow I gave you

## 2015-06-18 DIAGNOSIS — M7711 Lateral epicondylitis, right elbow: Secondary | ICD-10-CM | POA: Diagnosis not present

## 2015-06-18 DIAGNOSIS — M25531 Pain in right wrist: Secondary | ICD-10-CM | POA: Diagnosis not present

## 2015-06-18 DIAGNOSIS — E784 Other hyperlipidemia: Secondary | ICD-10-CM | POA: Diagnosis not present

## 2015-06-18 DIAGNOSIS — I10 Essential (primary) hypertension: Secondary | ICD-10-CM | POA: Diagnosis not present

## 2015-07-02 DIAGNOSIS — M25531 Pain in right wrist: Secondary | ICD-10-CM | POA: Diagnosis not present

## 2015-07-02 DIAGNOSIS — M7711 Lateral epicondylitis, right elbow: Secondary | ICD-10-CM | POA: Diagnosis not present

## 2015-07-09 DIAGNOSIS — M25531 Pain in right wrist: Secondary | ICD-10-CM | POA: Diagnosis not present

## 2015-07-09 DIAGNOSIS — M7711 Lateral epicondylitis, right elbow: Secondary | ICD-10-CM | POA: Diagnosis not present

## 2015-07-09 DIAGNOSIS — S56511A Strain of other extensor muscle, fascia and tendon at forearm level, right arm, initial encounter: Secondary | ICD-10-CM | POA: Diagnosis not present

## 2015-07-16 DIAGNOSIS — Z1272 Encounter for screening for malignant neoplasm of vagina: Secondary | ICD-10-CM | POA: Diagnosis not present

## 2015-07-16 DIAGNOSIS — Z6826 Body mass index (BMI) 26.0-26.9, adult: Secondary | ICD-10-CM | POA: Diagnosis not present

## 2015-07-16 DIAGNOSIS — Z1231 Encounter for screening mammogram for malignant neoplasm of breast: Secondary | ICD-10-CM | POA: Diagnosis not present

## 2015-07-16 DIAGNOSIS — Z124 Encounter for screening for malignant neoplasm of cervix: Secondary | ICD-10-CM | POA: Diagnosis not present

## 2015-07-16 DIAGNOSIS — Z90710 Acquired absence of both cervix and uterus: Secondary | ICD-10-CM | POA: Diagnosis not present

## 2015-07-18 DIAGNOSIS — M47812 Spondylosis without myelopathy or radiculopathy, cervical region: Secondary | ICD-10-CM | POA: Diagnosis not present

## 2015-07-18 DIAGNOSIS — M7711 Lateral epicondylitis, right elbow: Secondary | ICD-10-CM | POA: Diagnosis not present

## 2015-07-25 DIAGNOSIS — M7711 Lateral epicondylitis, right elbow: Secondary | ICD-10-CM | POA: Diagnosis not present

## 2015-08-01 DIAGNOSIS — M7711 Lateral epicondylitis, right elbow: Secondary | ICD-10-CM | POA: Diagnosis not present

## 2015-08-01 DIAGNOSIS — M25521 Pain in right elbow: Secondary | ICD-10-CM | POA: Diagnosis not present

## 2015-08-08 DIAGNOSIS — M7711 Lateral epicondylitis, right elbow: Secondary | ICD-10-CM | POA: Diagnosis not present

## 2015-08-08 DIAGNOSIS — M25521 Pain in right elbow: Secondary | ICD-10-CM | POA: Diagnosis not present

## 2015-08-15 DIAGNOSIS — M7711 Lateral epicondylitis, right elbow: Secondary | ICD-10-CM | POA: Diagnosis not present

## 2015-08-20 DIAGNOSIS — M7711 Lateral epicondylitis, right elbow: Secondary | ICD-10-CM | POA: Diagnosis not present

## 2015-08-29 DIAGNOSIS — M7711 Lateral epicondylitis, right elbow: Secondary | ICD-10-CM | POA: Diagnosis not present

## 2015-08-29 DIAGNOSIS — M25521 Pain in right elbow: Secondary | ICD-10-CM | POA: Diagnosis not present

## 2015-09-03 ENCOUNTER — Encounter: Payer: Self-pay | Admitting: Cardiology

## 2015-09-03 ENCOUNTER — Ambulatory Visit (INDEPENDENT_AMBULATORY_CARE_PROVIDER_SITE_OTHER): Payer: Medicare Other | Admitting: Cardiology

## 2015-09-03 VITALS — BP 131/73 | HR 56 | Ht 64.0 in | Wt 153.4 lb

## 2015-09-03 DIAGNOSIS — I10 Essential (primary) hypertension: Secondary | ICD-10-CM

## 2015-09-03 DIAGNOSIS — I340 Nonrheumatic mitral (valve) insufficiency: Secondary | ICD-10-CM

## 2015-09-03 DIAGNOSIS — I471 Supraventricular tachycardia: Secondary | ICD-10-CM

## 2015-09-03 NOTE — Patient Instructions (Signed)

## 2015-09-03 NOTE — Progress Notes (Signed)
Cardiology Office Note  Date: 09/03/2015   ID: Brandi [name], Brandi [name] 1940/12/19, MRN [id number]  PCP: Neale Burly, MD  Primary Cardiologist: Rozann Lesches, MD   Chief Complaint  Patient presents with  . PSVT    History of Present Illness: Brandi [name] is a 75 y.o. female last seen in April 2016. She presents for a routine follow-up visit. Since last evaluation she has had no significant palpitations or chest pain. I reviewed her ECG today which shows sinus rhythm with borderline low voltage. I reviewed her medications which are outlined below. Cardiac regimen includes aspirin, Toprol-XL, and Pravachol.  She states that she had a TIA last summer and is now on Plavix in addition to the above. She follows regularly with Dr. Sherrie Sport. Reportedly, no significant carotid artery disease or cardiac arrhythmias noted at that time.  Past Medical History  Diagnosis Date  . Diverticulosis   . Sinus bradycardia   . Irritable bowel syndrome   . Essential hypertension, benign   . Mitral regurgitation     Mild to moderate 09/2012  . Mixed hyperlipidemia   . PSVT (paroxysmal supraventricular tachycardia) ([place])     Brief by monitoring    Current Outpatient Prescriptions  Medication Sig Dispense Refill  . aspirin 81 MG EC tablet Take 81 mg by mouth daily.      . Cholecalciferol (VITAMIN D) 2000 UNITS CAPS Take 2,000 Units by mouth daily.     . clopidogrel (PLAVIX) 75 MG tablet Take 75 mg by mouth daily.    Marland Kitchen dicyclomine (BENTYL) 10 MG capsule Take 10 mg by mouth 2 (two) times daily.     . fish oil-omega-3 fatty acids 1000 MG capsule Take 1 g by mouth daily.     . hydrochlorothiazide (MICROZIDE) 12.5 MG capsule Take 1 capsule (12.5 mg total) by mouth daily. 90 capsule 3  . meclizine (ANTIVERT) 12.5 MG tablet Take 1 tablet (12.5 mg total) by mouth 3 (three) times daily as needed. 90 tablet 1  . metoprolol succinate (TOPROL-XL) 25 MG 24 hr tablet TAKE ONE-HALF TABLET BY MOUTH ONCE DAILY 45  tablet 3  . Multiple Vitamin (MULTIVITAMIN) tablet Take 1 tablet by mouth daily.    . pravastatin (PRAVACHOL) 20 MG tablet Take 1 tablet (20 mg total) by mouth daily. 30 tablet 5   No current facility-administered medications for this visit.   Allergies:  Sulfa antibiotics and Sudafed   Social History: The patient  reports that she quit smoking about 17 years ago. Her smoking use included Cigarettes. She has never used smokeless tobacco. She reports that she does not drink alcohol or use illicit drugs.   ROS:  Please see the history of present illness. Otherwise, complete review of systems is positive for right forearm tendon rupture, currently healing.  All other systems are reviewed and negative.   Physical Exam: VS:  BP 131/73 mmHg  Pulse 56  Ht 5\' 4"  (1.626 m)  Wt 153 lb 6.4 oz (69.582 kg)  BMI 26.32 kg/m2  SpO2 97%, BMI Body mass index is 26.32 kg/(m^2).  Wt Readings from Last 3 Encounters:  09/03/15 153 lb 6.4 oz (69.582 kg)  05/30/15 151 lb 6.4 oz (68.675 kg)  11/14/14 163 lb (73.936 kg)    Appears comfortable at rest.  HEENT: Conjunctiva and lids normal, oropharynx clear.  Neck: Supple, no elevated JVP or carotid bruits, no thyromegaly.  Lungs: Clear to auscultation, nonlabored breathing at rest.  Cardiac: Regular rate and rhythm, no  S3 or significant systolic murmur, no pericardial rub.  Abdomen: Soft, nontender,bowel sounds present.  Extremities: No pitting edema, distal pulses 2+.  ECG: I personally reviewed the prior tracing from 08/29/2014 which showed sinus rhythm with borderline low voltage.  Recent Labwork:    Component Value Date/Time   CHOL 228* 09/08/2013 1147   CHOL 154 08/24/2012 1212   TRIG 218* 09/08/2013 1147   TRIG 131 08/24/2012 1212   HDL 71 09/08/2013 1147   HDL 68 08/24/2012 1212   LDLCALC 113* 09/08/2013 1147   LDLCALC 60 08/24/2012 1212    Other Studies Reviewed Today:  Echocardiogram May 2014: LVEF 55-60% with grade 2 diastolic  dysfunction, mild to moderate left atrial enlargement, atrial septal aneurysm, mild to moderate mitral regurgitation, PASP 35-40 mm mercury.  Cardiac monitor July 2014: Sinus rhythm with occasional to frequent PVCs, brief episodes of PSVT, no pauses or atrial fibrillation.  Assessment and Plan:   1. PSVT, no significant increase in frequency or intensity on beta blocker therapy. Continue observation. ECG reviewed and stable.  2. History of mild to moderate mitral regurgitation, asymptomatic. No change on examination.  3. Essential hypertension, blood pressure is adequately controlled today.  Current medicines were reviewed with the patient today.   Orders Placed This Encounter  Procedures  . EKG 12-Lead    Disposition: FU with me in 1 year.    Signed, Satira Sark, MD, [facility] 09/03/2015 10:09 AM    [place] at [place], [place], [place] [address] Phone: [phone]; Fax: [phone]

## 2015-09-05 DIAGNOSIS — M25621 Stiffness of right elbow, not elsewhere classified: Secondary | ICD-10-CM | POA: Diagnosis not present

## 2015-09-05 DIAGNOSIS — R531 Weakness: Secondary | ICD-10-CM | POA: Diagnosis not present

## 2015-09-12 DIAGNOSIS — M25621 Stiffness of right elbow, not elsewhere classified: Secondary | ICD-10-CM | POA: Diagnosis not present

## 2015-09-12 DIAGNOSIS — R531 Weakness: Secondary | ICD-10-CM | POA: Diagnosis not present

## 2015-09-19 DIAGNOSIS — R531 Weakness: Secondary | ICD-10-CM | POA: Diagnosis not present

## 2015-09-20 DIAGNOSIS — I10 Essential (primary) hypertension: Secondary | ICD-10-CM | POA: Diagnosis not present

## 2015-09-20 DIAGNOSIS — E784 Other hyperlipidemia: Secondary | ICD-10-CM | POA: Diagnosis not present

## 2015-09-20 DIAGNOSIS — K589 Irritable bowel syndrome without diarrhea: Secondary | ICD-10-CM | POA: Diagnosis not present

## 2015-09-20 DIAGNOSIS — Z1389 Encounter for screening for other disorder: Secondary | ICD-10-CM | POA: Diagnosis not present

## 2015-09-20 DIAGNOSIS — Z0000 Encounter for general adult medical examination without abnormal findings: Secondary | ICD-10-CM | POA: Diagnosis not present

## 2015-10-17 DIAGNOSIS — M7711 Lateral epicondylitis, right elbow: Secondary | ICD-10-CM | POA: Diagnosis not present

## 2015-11-07 DIAGNOSIS — M7711 Lateral epicondylitis, right elbow: Secondary | ICD-10-CM | POA: Diagnosis not present

## 2015-12-05 DIAGNOSIS — R202 Paresthesia of skin: Secondary | ICD-10-CM | POA: Diagnosis not present

## 2015-12-05 DIAGNOSIS — M4722 Other spondylosis with radiculopathy, cervical region: Secondary | ICD-10-CM | POA: Diagnosis not present

## 2015-12-19 DIAGNOSIS — M47812 Spondylosis without myelopathy or radiculopathy, cervical region: Secondary | ICD-10-CM | POA: Diagnosis not present

## 2015-12-19 DIAGNOSIS — G5601 Carpal tunnel syndrome, right upper limb: Secondary | ICD-10-CM | POA: Diagnosis not present

## 2015-12-27 DIAGNOSIS — K589 Irritable bowel syndrome without diarrhea: Secondary | ICD-10-CM | POA: Diagnosis not present

## 2015-12-27 DIAGNOSIS — I10 Essential (primary) hypertension: Secondary | ICD-10-CM | POA: Diagnosis not present

## 2015-12-27 DIAGNOSIS — E784 Other hyperlipidemia: Secondary | ICD-10-CM | POA: Diagnosis not present

## 2016-01-07 DIAGNOSIS — E784 Other hyperlipidemia: Secondary | ICD-10-CM | POA: Diagnosis not present

## 2016-01-07 DIAGNOSIS — I10 Essential (primary) hypertension: Secondary | ICD-10-CM | POA: Diagnosis not present

## 2016-01-07 DIAGNOSIS — K589 Irritable bowel syndrome without diarrhea: Secondary | ICD-10-CM | POA: Diagnosis not present

## 2016-01-29 DIAGNOSIS — I10 Essential (primary) hypertension: Secondary | ICD-10-CM | POA: Diagnosis not present

## 2016-01-29 DIAGNOSIS — K589 Irritable bowel syndrome without diarrhea: Secondary | ICD-10-CM | POA: Diagnosis not present

## 2016-01-29 DIAGNOSIS — E784 Other hyperlipidemia: Secondary | ICD-10-CM | POA: Diagnosis not present

## 2016-01-30 DIAGNOSIS — G5601 Carpal tunnel syndrome, right upper limb: Secondary | ICD-10-CM | POA: Diagnosis not present

## 2016-01-30 DIAGNOSIS — M7711 Lateral epicondylitis, right elbow: Secondary | ICD-10-CM | POA: Diagnosis not present

## 2016-04-02 ENCOUNTER — Ambulatory Visit (INDEPENDENT_AMBULATORY_CARE_PROVIDER_SITE_OTHER): Payer: Medicare Other

## 2016-04-02 DIAGNOSIS — Z23 Encounter for immunization: Secondary | ICD-10-CM

## 2016-04-08 DIAGNOSIS — I6789 Other cerebrovascular disease: Secondary | ICD-10-CM | POA: Diagnosis not present

## 2016-04-08 DIAGNOSIS — I10 Essential (primary) hypertension: Secondary | ICD-10-CM | POA: Diagnosis not present

## 2016-04-08 DIAGNOSIS — E784 Other hyperlipidemia: Secondary | ICD-10-CM | POA: Diagnosis not present

## 2016-04-21 ENCOUNTER — Other Ambulatory Visit: Payer: Self-pay | Admitting: Cardiology

## 2016-05-28 DIAGNOSIS — M7711 Lateral epicondylitis, right elbow: Secondary | ICD-10-CM | POA: Diagnosis not present

## 2016-05-28 DIAGNOSIS — G5601 Carpal tunnel syndrome, right upper limb: Secondary | ICD-10-CM | POA: Diagnosis not present

## 2016-06-20 DIAGNOSIS — E784 Other hyperlipidemia: Secondary | ICD-10-CM | POA: Diagnosis not present

## 2016-06-20 DIAGNOSIS — I10 Essential (primary) hypertension: Secondary | ICD-10-CM | POA: Diagnosis not present

## 2016-06-20 DIAGNOSIS — K589 Irritable bowel syndrome without diarrhea: Secondary | ICD-10-CM | POA: Diagnosis not present

## 2016-06-25 DIAGNOSIS — G5601 Carpal tunnel syndrome, right upper limb: Secondary | ICD-10-CM | POA: Diagnosis not present

## 2016-06-26 ENCOUNTER — Other Ambulatory Visit: Payer: Self-pay | Admitting: Orthopedic Surgery

## 2016-07-09 ENCOUNTER — Encounter (HOSPITAL_BASED_OUTPATIENT_CLINIC_OR_DEPARTMENT_OTHER): Payer: Self-pay | Admitting: *Deleted

## 2016-07-09 NOTE — Progress Notes (Signed)
Pt's pmh and cardiology notes reviewed by Dr. Smith Robert. Ok for surgery 07/15/16 without further testing.

## 2016-07-10 DIAGNOSIS — E784 Other hyperlipidemia: Secondary | ICD-10-CM | POA: Diagnosis not present

## 2016-07-10 DIAGNOSIS — I10 Essential (primary) hypertension: Secondary | ICD-10-CM | POA: Diagnosis not present

## 2016-07-10 DIAGNOSIS — I6789 Other cerebrovascular disease: Secondary | ICD-10-CM | POA: Diagnosis not present

## 2016-07-11 ENCOUNTER — Encounter (HOSPITAL_COMMUNITY)
Admission: RE | Admit: 2016-07-11 | Discharge: 2016-07-11 | Disposition: A | Discharge status: Home / Self Care | Payer: Medicare Other | Source: Ambulatory Visit | Attending: Orthopedic Surgery | Admitting: Orthopedic Surgery

## 2016-07-11 DIAGNOSIS — Z01812 Encounter for preprocedural laboratory examination: Secondary | ICD-10-CM | POA: Insufficient documentation

## 2016-07-11 LAB — BASIC METABOLIC PANEL
Anion gap: 8 (ref 5–15)
BUN: 15 mg/dL (ref 6–20)
CHLORIDE: 104 mmol/L (ref 101–111)
CO2: 26 mmol/L (ref 22–32)
CREATININE: 0.78 mg/dL (ref 0.44–1.00)
Calcium: 9.7 mg/dL (ref 8.9–10.3)
GFR calc non Af Amer: >60 mL/min (ref >60)
Glucose, Bld: 100 mg/dL — ABNORMAL HIGH (ref 65–99)
Potassium: 3.3 mmol/L — ABNORMAL LOW (ref 3.5–5.1)
SODIUM: 138 mmol/L (ref 135–145)

## 2016-07-15 ENCOUNTER — Encounter (HOSPITAL_BASED_OUTPATIENT_CLINIC_OR_DEPARTMENT_OTHER)
Admission: RE | Disposition: A | Discharge status: Home / Self Care | Payer: Self-pay | Source: Ambulatory Visit | Attending: Orthopedic Surgery

## 2016-07-15 ENCOUNTER — Encounter (HOSPITAL_BASED_OUTPATIENT_CLINIC_OR_DEPARTMENT_OTHER): Payer: Self-pay | Admitting: *Deleted

## 2016-07-15 ENCOUNTER — Ambulatory Visit (HOSPITAL_BASED_OUTPATIENT_CLINIC_OR_DEPARTMENT_OTHER): Payer: Medicare Other | Admitting: Anesthesiology

## 2016-07-15 ENCOUNTER — Ambulatory Visit (HOSPITAL_BASED_OUTPATIENT_CLINIC_OR_DEPARTMENT_OTHER)
Admission: RE | Admit: 2016-07-15 | Discharge: 2016-07-15 | Disposition: A | Discharge status: Home / Self Care | Payer: Medicare Other | Source: Ambulatory Visit | Attending: Orthopedic Surgery | Admitting: Orthopedic Surgery

## 2016-07-15 DIAGNOSIS — G5601 Carpal tunnel syndrome, right upper limb: Secondary | ICD-10-CM | POA: Insufficient documentation

## 2016-07-15 DIAGNOSIS — I10 Essential (primary) hypertension: Secondary | ICD-10-CM | POA: Insufficient documentation

## 2016-07-15 DIAGNOSIS — Z87891 Personal history of nicotine dependence: Secondary | ICD-10-CM | POA: Diagnosis not present

## 2016-07-15 DIAGNOSIS — Z7982 Long term (current) use of aspirin: Secondary | ICD-10-CM | POA: Insufficient documentation

## 2016-07-15 DIAGNOSIS — Z8673 Personal history of transient ischemic attack (TIA), and cerebral infarction without residual deficits: Secondary | ICD-10-CM | POA: Diagnosis not present

## 2016-07-15 DIAGNOSIS — I471 Supraventricular tachycardia: Secondary | ICD-10-CM | POA: Insufficient documentation

## 2016-07-15 DIAGNOSIS — Z79899 Other long term (current) drug therapy: Secondary | ICD-10-CM | POA: Insufficient documentation

## 2016-07-15 DIAGNOSIS — E782 Mixed hyperlipidemia: Secondary | ICD-10-CM | POA: Diagnosis not present

## 2016-07-15 DIAGNOSIS — Z7902 Long term (current) use of antithrombotics/antiplatelets: Secondary | ICD-10-CM | POA: Diagnosis not present

## 2016-07-15 HISTORY — DX: Cerebral infarction, unspecified: I63.9

## 2016-07-15 HISTORY — PX: CARPAL TUNNEL RELEASE: SHX101

## 2016-07-15 SURGERY — CARPAL TUNNEL RELEASE
Anesthesia: Regional | Laterality: Right

## 2016-07-15 MED ORDER — FENTANYL CITRATE (PF) 100 MCG/2ML IJ SOLN
50.0000 ug | INTRAMUSCULAR | Status: DC | PRN
  Administered 2016-07-15 (×2): 25 ug via INTRAVENOUS

## 2016-07-15 MED ORDER — OXYCODONE HCL 5 MG/5ML PO SOLN: 5.0000 mg | Freq: Once | ORAL | Status: DC | PRN

## 2016-07-15 MED ORDER — PROPOFOL 500 MG/50ML IV EMUL
INTRAVENOUS | Status: DC | PRN
  Administered 2016-07-15 (×2): 20 ug via INTRAVENOUS

## 2016-07-15 MED ORDER — CEFAZOLIN SODIUM-DEXTROSE 2-4 GM/100ML-% IV SOLN
2.0000 g | INTRAVENOUS | Status: AC
  Administered 2016-07-15: 2 g via INTRAVENOUS

## 2016-07-15 MED ORDER — CEFAZOLIN SODIUM-DEXTROSE 2-4 GM/100ML-% IV SOLN
INTRAVENOUS | Status: AC
  Filled 2016-07-15: qty 100

## 2016-07-15 MED ORDER — LACTATED RINGERS IV SOLN: INTRAVENOUS | Status: DC

## 2016-07-15 MED ORDER — OXYCODONE HCL 5 MG PO TABS: 5.0000 mg | ORAL_TABLET | Freq: Once | ORAL | Status: DC | PRN

## 2016-07-15 MED ORDER — ONDANSETRON HCL 4 MG/2ML IJ SOLN
INTRAMUSCULAR | Status: DC | PRN
  Administered 2016-07-15: 4 mg via INTRAVENOUS

## 2016-07-15 MED ORDER — FENTANYL CITRATE (PF) 100 MCG/2ML IJ SOLN
INTRAMUSCULAR | Status: AC
  Filled 2016-07-15: qty 2

## 2016-07-15 MED ORDER — PROPOFOL 500 MG/50ML IV EMUL
INTRAVENOUS | Status: DC | PRN
  Administered 2016-07-15: 50 ug/kg/min via INTRAVENOUS

## 2016-07-15 MED ORDER — MIDAZOLAM HCL 2 MG/2ML IJ SOLN: 1.0000 mg | INTRAMUSCULAR | Status: DC | PRN

## 2016-07-15 MED ORDER — CHLORHEXIDINE GLUCONATE 4 % EX LIQD: 60.0000 mL | Freq: Once | CUTANEOUS | Status: DC

## 2016-07-15 MED ORDER — LACTATED RINGERS IV SOLN
INTRAVENOUS | Status: DC
  Administered 2016-07-15: 11:00:00 via INTRAVENOUS

## 2016-07-15 MED ORDER — HYDROCODONE-ACETAMINOPHEN 5-325 MG PO TABS: 1.0000 | ORAL_TABLET | Freq: Four times a day (QID) | ORAL | 0 refills | Status: DC | PRN

## 2016-07-15 MED ORDER — LIDOCAINE HCL (CARDIAC) 20 MG/ML IV SOLN
INTRAVENOUS | Status: DC | PRN
Start: 2016-07-15 — End: 2016-07-15
  Administered 2016-07-15: 30 mg via INTRAVENOUS

## 2016-07-15 MED ORDER — FENTANYL CITRATE (PF) 100 MCG/2ML IJ SOLN: 25.0000 ug | INTRAMUSCULAR | Status: DC | PRN

## 2016-07-15 MED ORDER — BUPIVACAINE HCL (PF) 0.25 % IJ SOLN
INTRAMUSCULAR | Status: DC | PRN
  Administered 2016-07-15: 7 mL

## 2016-07-15 MED ORDER — SCOPOLAMINE 1 MG/3DAYS TD PT72: 1.0000 | MEDICATED_PATCH | Freq: Once | TRANSDERMAL | Status: DC | PRN

## 2016-07-15 SURGICAL SUPPLY — 43 items
BLADE SURG 15 STRL LF DISP TIS (BLADE) ×1 IMPLANT
BLADE SURG 15 STRL SS (BLADE) ×3
BNDG CMPR 9X4 STRL LF SNTH (GAUZE/BANDAGES/DRESSINGS)
BNDG COHESIVE 3X5 TAN STRL LF (GAUZE/BANDAGES/DRESSINGS) ×3 IMPLANT
BNDG ESMARK 4X9 LF (GAUZE/BANDAGES/DRESSINGS) IMPLANT
BNDG GAUZE ELAST 4 BULKY (GAUZE/BANDAGES/DRESSINGS) ×3 IMPLANT
CHLORAPREP W/TINT 26ML (MISCELLANEOUS) ×3 IMPLANT
CORDS BIPOLAR (ELECTRODE) ×3 IMPLANT
COVER BACK TABLE 60X90IN (DRAPES) ×3 IMPLANT
COVER MAYO STAND STRL (DRAPES) ×3 IMPLANT
CUFF TOURNIQUET SINGLE 18IN (TOURNIQUET CUFF) ×3 IMPLANT
DRAPE EXTREMITY T 121X128X90 (DRAPE) ×3 IMPLANT
DRAPE SURG 17X23 STRL (DRAPES) ×3 IMPLANT
DRSG PAD ABDOMINAL 8X10 ST (GAUZE/BANDAGES/DRESSINGS) ×3 IMPLANT
GAUZE SPONGE 4X4 12PLY STRL (GAUZE/BANDAGES/DRESSINGS) ×3 IMPLANT
GAUZE XEROFORM 1X8 LF (GAUZE/BANDAGES/DRESSINGS) ×3 IMPLANT
GLOVE BIOGEL PI IND STRL 6.5 (GLOVE) IMPLANT
GLOVE BIOGEL PI IND STRL 7.0 (GLOVE) IMPLANT
GLOVE BIOGEL PI IND STRL 7.5 (GLOVE) IMPLANT
GLOVE BIOGEL PI IND STRL 8.5 (GLOVE) ×1 IMPLANT
GLOVE BIOGEL PI INDICATOR 6.5 (GLOVE) ×2
GLOVE BIOGEL PI INDICATOR 7.0 (GLOVE) ×2
GLOVE BIOGEL PI INDICATOR 7.5 (GLOVE) ×2
GLOVE BIOGEL PI INDICATOR 8.5 (GLOVE) ×2
GLOVE SURG ORTHO 8.0 STRL STRW (GLOVE) ×3 IMPLANT
GLOVE SURG SS PI 6.5 STRL IVOR (GLOVE) ×3 IMPLANT
GLOVE SURG SYN 7.5  E (GLOVE) ×2
GLOVE SURG SYN 7.5 E (GLOVE) ×1 IMPLANT
GLOVE SURG SYN 7.5 PF PI (GLOVE) IMPLANT
GOWN STRL REUS W/ TWL LRG LVL3 (GOWN DISPOSABLE) ×1 IMPLANT
GOWN STRL REUS W/TWL LRG LVL3 (GOWN DISPOSABLE) ×3
GOWN STRL REUS W/TWL XL LVL3 (GOWN DISPOSABLE) ×3 IMPLANT
NDL PRECISIONGLIDE 27X1.5 (NEEDLE) IMPLANT
NEEDLE PRECISIONGLIDE 27X1.5 (NEEDLE) ×3 IMPLANT
NS IRRIG 1000ML POUR BTL (IV SOLUTION) ×3 IMPLANT
PACK BASIN DAY SURGERY FS (CUSTOM PROCEDURE TRAY) ×3 IMPLANT
STOCKINETTE 4X48 STRL (DRAPES) ×3 IMPLANT
SUT ETHILON 4 0 PS 2 18 (SUTURE) ×3 IMPLANT
SUT VICRYL 4-0 PS2 18IN ABS (SUTURE) IMPLANT
SYR BULB 3OZ (MISCELLANEOUS) ×3 IMPLANT
SYR CONTROL 10ML LL (SYRINGE) ×2 IMPLANT
TOWEL OR 17X24 6PK STRL BLUE (TOWEL DISPOSABLE) ×3 IMPLANT
UNDERPAD 30X30 (UNDERPADS AND DIAPERS) ×3 IMPLANT

## 2016-07-15 NOTE — Anesthesia Preprocedure Evaluation (Signed)
Anesthesia Evaluation  Patient identified by MRN, date of birth, ID band Patient awake    Reviewed: Allergy & Precautions, NPO status , Patient's Chart, lab work & pertinent test results, reviewed documented beta blocker date and time   Airway Mallampati: I  TM Distance: >3 FB Neck ROM: Full    Dental  (+) Teeth Intact, Dental Advisory Given   Pulmonary former smoker,    breath sounds clear to auscultation       Cardiovascular hypertension, Pt. on medications and Pt. on home beta blockers  Rhythm:Regular Rate:Normal     Neuro/Psych CVA negative psych ROS   GI/Hepatic negative GI ROS, Neg liver ROS,   Endo/Other  negative endocrine ROS  Renal/GU negative Renal ROS  negative genitourinary   Musculoskeletal negative musculoskeletal ROS (+)   Abdominal   Peds negative pediatric ROS (+)  Hematology negative hematology ROS (+)   Anesthesia Other Findings   Reproductive/Obstetrics negative OB ROS                             EKG: normal sinus rhythm.  Anesthesia Physical Anesthesia Plan  ASA: II  Anesthesia Plan: Bier Block   Post-op Pain Management:    Induction: Intravenous  Airway Management Planned: Natural Airway  Additional Equipment:   Intra-op Plan:   Post-operative Plan:   Informed Consent: I have reviewed the patients History and Physical, chart, labs and discussed the procedure including the risks, benefits and alternatives for the proposed anesthesia with the patient or authorized representative who has indicated his/her understanding and acceptance.     Plan Discussed with: CRNA  Anesthesia Plan Comments:         Anesthesia Quick Evaluation

## 2016-07-15 NOTE — Anesthesia Postprocedure Evaluation (Addendum)
Anesthesia Post Note  Patient: Brandi [name]  Procedure(s) Performed: Procedure(s) (LRB): RIGHT CARPAL TUNNEL RELEASE (Right)  Patient location during evaluation: PACU Anesthesia Type: Bier Block Level of consciousness: awake and alert Pain management: pain level controlled Vital Signs Assessment: post-procedure vital signs reviewed and stable Respiratory status: spontaneous breathing, nonlabored ventilation, respiratory function stable and patient connected to nasal cannula oxygen Cardiovascular status: stable and blood pressure returned to baseline Anesthetic complications: no       Last Vitals:  Vitals:   07/15/16 1225 07/15/16 1256  BP: (!) 131/55 (!) 124/58  Pulse: (!) 52 (!) 50  Resp: 16 16  Temp:  37 C    Last Pain:  Vitals:   07/15/16 1256  TempSrc: Oral  PainSc: 0-No pain                 Effie Berkshire

## 2016-07-15 NOTE — Transfer of Care (Signed)
Immediate Anesthesia Transfer of Care Note  Patient: Brandi [name]  Procedure(s) Performed: Procedure(s): RIGHT CARPAL TUNNEL RELEASE (Right)  Patient Location: PACU  Anesthesia Type:Bier block  Level of Consciousness: awake, oriented and sedated  Airway & Oxygen Therapy: Patient Spontanous Breathing and Patient connected to face mask oxygen  Post-op Assessment: Report given to RN and Post -op Vital signs reviewed and stable  Post vital signs: Reviewed and stable  Last Vitals:  Vitals:   07/15/16 1029  BP: (!) 143/64  Pulse: 62  Resp: 18  Temp: 36.6 C    Last Pain:  Vitals:   07/15/16 1029  TempSrc: Oral  PainSc: 4          Complications: No apparent anesthesia complications

## 2016-07-15 NOTE — Op Note (Signed)
NAMELEAHMARIE, [name] NO.:  [id number]  MEDICAL RECORD NO.:  [id number]  LOCATION:                                 FACILITY:  PHYSICIAN:  Daryll Brod, M.D.            DATE OF BIRTH:  DATE OF PROCEDURE:  07/15/2016 DATE OF DISCHARGE:                              OPERATIVE REPORT   PREOPERATIVE DIAGNOSIS:  Carpal tunnel syndrome, right hand.  POSTOPERATIVE DIAGNOSIS:  Carpal tunnel syndrome, right hand.  OPERATION:  Decompression of right median nerve.  SURGEON:  Daryll Brod, MD.  ASSISTANT:  None.  ANESTHESIA:  Forearm-based IV regional with local infiltration.  PLACE OF SURGERY:  [facility].  ANESTHESIOLOGIST:  [place].  HISTORY:  The patient is a 76 year old female with a history of carpal tunnel syndrome.  EMG nerve conduction is positive, which has not responded to conservative treatment.  She has elected to undergo surgical decompression of the median nerve on her right side.  Pre, peri, and postoperative course have been discussed along with risks and complications.  She is aware that there is no guarantee to the surgery; the possibility of infection; recurrence of injury to arteries, nerves, tendons; incomplete relief of symptoms and dystrophy.  In the preoperative area, the patient was seen.  The extremity was marked by both patient and surgeon.  Antibiotic given.  PROCEDURE IN DETAIL:  The patient was brought to the operating room, where a forearm-based IV regional anesthetic was carried out without difficulty.  She was prepped using ChloraPrep in the supine position with the right arm free.  A 3-minute dry time was allowed, and time-out taken, confirming the patient and procedure.  A longitudinal incision was made in the right palm, carried down through subcutaneous tissue. Bleeders were electrocauterized with bipolar.  The dissection was carried down to the palmar fascia.  This was longitudinally split.  The flexor retinaculum was  identified.  At the distal margin, the superficial palmar arch and flexor tendon to the ring and little finger were identified.  Retractors were placed protecting the median nerve radially and the ulnar nerve ulnarly.  The flexor retinaculum was then incised on its ulnar border using sharp dissection.  The subcutaneous tissue was dissected from the underlying flexor retinaculum and distal forearm fascia and Sewell retractor was placed.  Right angle was also placed.  The deep tissue was then dissected free dorsally.  The proximal aspect of the palmar fascia and distal forearm fascia was then released with blunt scissors for approximately 2 cm proximal to the wrist crease under direct vision.  The canal was explored.  The area of compression to the nerve was apparent.  The motor branch entered into muscle distally.  The wound was copiously irrigated with saline.  The skin was closed with interrupted 4-0 nylon sutures.  A local infiltration with 0.25% bupivacaine without epinephrine was given. Approximately 8 mL was used.  A sterile compressive dressing with the fingers free was applied.  On deflation of the tourniquet, all fingers immediately pinked.  She was taken to the recovery room for observation in satisfactory condition.  She will be discharged to home  to return to [place] in 1 week, on [place].          ______________________________ Daryll Brod, M.D.     GK/MEDQ  D:  07/15/2016  T:  07/15/2016  Job:  [id number]

## 2016-07-15 NOTE — Op Note (Signed)
Dictation Number dictated 07/15/2016@12 :18

## 2016-07-15 NOTE — H&P (Signed)
  Brandi [name] is an 76 y.o. female.   Chief Complaint: numbness right hand HPI: She is a 76 year old right-hand-dominant female with carpal tunnel syndrome CMC arthritis right hand.She is complaining of pain in the central aspect of her right forearm and wrist area. She continues to complain of numbness and tingling. She has an aching pain moderate in nature. Is waking her at night. She is on Plavix for stroke. She was not allowed to take the Celebrex which we had recommended. Think seems to make this better or worse for her. Is relatively constant. She has had injections to the carpal canal in the past. This given her relief but only temporary. She has had nerve conductions done by Dr. Thereasa Parkin.      Past Medical History:  Diagnosis Date  . Diverticulosis   . Essential hypertension, benign   . Irritable bowel syndrome   . Mitral regurgitation    Mild to moderate 09/2012  . Mixed hyperlipidemia   . PSVT (paroxysmal supraventricular tachycardia) ([place])    Brief by monitoring  . Sinus bradycardia     Past Surgical History:  Procedure Laterality Date  . ABDOMINAL HYSTERECTOMY  06-02-2008  . ORIF HIP FRACTURE      Family History  Problem Relation Age of Onset  . Stroke Mother   . Asthma Father   . COPD Father   . CAD Sister    Social History:  reports that she quit smoking about 18 years ago. Her smoking use included Cigarettes. She has never used smokeless tobacco. She reports that she does not drink alcohol or use drugs.  Allergies:  Allergies  Allergen Reactions  . Sulfa Antibiotics Anaphylaxis  . Sudafed [Pseudoephedrine Hcl]     No prescriptions prior to admission.    No results found for this or any previous visit (from the past 48 hour(s)).  No results found.   Pertinent items are noted in HPI.  Height 5\' 4"  (1.626 m), weight 68 kg (150 lb).  General appearance: alert, cooperative and appears stated age Head: Normocephalic, without obvious abnormality Neck:  no JVD Resp: clear to auscultation bilaterally Cardio: regular rate and rhythm, S1, S2 normal, no murmur, click, rub or gallop GI: soft, non-tender; bowel sounds normal; no masses,  no organomegaly Extremities: numbness right hand Pulses: 2+ and symmetric Skin: Skin color, texture, turgor normal. No rashes or lesions Neurologic: Grossly normal Incision/Wound: na  Assessment/Plan Assessment:  1. Carpal tunnel syndrome of right wrist    Plan: With failure of conservative treatment we have recommended that she consider surgical release of the median nerve on her right side. Would not recommend doing anything to the arthritis at the present time. Pre-peri-and postoperative course been discussed along with risk applications. She is aware that there is no guarantee to the surgery the possibility of infection recurrence injury to arteries nerves tendons incomplete release symptoms and dystrophy. She is scheduled for right carpal tunnel tunnel release and outpatient under regional anesthesia      Hind Chesler R 07/15/2016, 10:04 AM

## 2016-07-15 NOTE — Brief Op Note (Signed)
07/15/2016  12:13 PM  PATIENT:  Brandi [name]  76 y.o. female  PRE-OPERATIVE DIAGNOSIS:  Right Carpal Tunnel   G56.01  POST-OPERATIVE DIAGNOSIS:  Right Carpal Tunnel   G56.01  PROCEDURE:  Procedure(s): RIGHT CARPAL TUNNEL RELEASE (Right)  SURGEON:  Surgeon(s) and Role:    * Daryll Brod, MD - Primary  PHYSICIAN ASSISTANT:   ASSISTANTS: none   ANESTHESIA:   local and regional  EBL:  No intake/output data recorded.  BLOOD ADMINISTERED:none  DRAINS: none   LOCAL MEDICATIONS USED:  BUPIVICAINE   SPECIMEN:  No Specimen  DISPOSITION OF SPECIMEN:  N/A  COUNTS:  YES  TOURNIQUET:   Total Tourniquet Time Documented: Forearm (Left) - 24 minutes Total: Forearm (Left) - 24 minutes   DICTATION: .Other Dictation: Dictation Number dictated 07/15/2016@12 :18  PLAN OF CARE: Discharge to home after PACU  PATIENT DISPOSITION:  PACU - hemodynamically stable.

## 2016-07-15 NOTE — Discharge Instructions (Addendum)

## 2016-07-16 ENCOUNTER — Encounter (HOSPITAL_BASED_OUTPATIENT_CLINIC_OR_DEPARTMENT_OTHER): Payer: Self-pay | Admitting: Orthopedic Surgery

## 2016-08-27 DIAGNOSIS — Z6826 Body mass index (BMI) 26.0-26.9, adult: Secondary | ICD-10-CM | POA: Diagnosis not present

## 2016-08-27 DIAGNOSIS — Z1231 Encounter for screening mammogram for malignant neoplasm of breast: Secondary | ICD-10-CM | POA: Diagnosis not present

## 2016-08-27 DIAGNOSIS — Z01419 Encounter for gynecological examination (general) (routine) without abnormal findings: Secondary | ICD-10-CM | POA: Diagnosis not present

## 2016-09-01 DIAGNOSIS — R42 Dizziness and giddiness: Secondary | ICD-10-CM | POA: Diagnosis not present

## 2016-09-01 DIAGNOSIS — E860 Dehydration: Secondary | ICD-10-CM | POA: Diagnosis not present

## 2016-09-01 DIAGNOSIS — I69322 Dysarthria following cerebral infarction: Secondary | ICD-10-CM | POA: Diagnosis not present

## 2016-09-01 DIAGNOSIS — M25551 Pain in right hip: Secondary | ICD-10-CM | POA: Diagnosis not present

## 2016-09-01 DIAGNOSIS — S299XXA Unspecified injury of thorax, initial encounter: Secondary | ICD-10-CM | POA: Diagnosis not present

## 2016-09-01 DIAGNOSIS — I10 Essential (primary) hypertension: Secondary | ICD-10-CM | POA: Diagnosis not present

## 2016-09-01 DIAGNOSIS — F411 Generalized anxiety disorder: Secondary | ICD-10-CM | POA: Diagnosis present

## 2016-09-01 DIAGNOSIS — S32591A Other specified fracture of right pubis, initial encounter for closed fracture: Secondary | ICD-10-CM | POA: Diagnosis not present

## 2016-09-01 DIAGNOSIS — Y92010 Kitchen of single-family (private) house as the place of occurrence of the external cause: Secondary | ICD-10-CM | POA: Diagnosis not present

## 2016-09-01 DIAGNOSIS — R296 Repeated falls: Secondary | ICD-10-CM | POA: Diagnosis not present

## 2016-09-01 DIAGNOSIS — S32591D Other specified fracture of right pubis, subsequent encounter for fracture with routine healing: Secondary | ICD-10-CM | POA: Diagnosis not present

## 2016-09-01 DIAGNOSIS — Z8673 Personal history of transient ischemic attack (TIA), and cerebral infarction without residual deficits: Secondary | ICD-10-CM | POA: Diagnosis not present

## 2016-09-01 DIAGNOSIS — S59901A Unspecified injury of right elbow, initial encounter: Secondary | ICD-10-CM | POA: Diagnosis not present

## 2016-09-01 DIAGNOSIS — R531 Weakness: Secondary | ICD-10-CM | POA: Diagnosis present

## 2016-09-01 DIAGNOSIS — Z8249 Family history of ischemic heart disease and other diseases of the circulatory system: Secondary | ICD-10-CM | POA: Diagnosis not present

## 2016-09-01 DIAGNOSIS — Z825 Family history of asthma and other chronic lower respiratory diseases: Secondary | ICD-10-CM | POA: Diagnosis not present

## 2016-09-01 DIAGNOSIS — Z780 Asymptomatic menopausal state: Secondary | ICD-10-CM | POA: Diagnosis not present

## 2016-09-01 DIAGNOSIS — Z7982 Long term (current) use of aspirin: Secondary | ICD-10-CM | POA: Diagnosis not present

## 2016-09-01 DIAGNOSIS — Z7902 Long term (current) use of antithrombotics/antiplatelets: Secondary | ICD-10-CM | POA: Diagnosis not present

## 2016-09-01 DIAGNOSIS — S32511A Fracture of superior rim of right pubis, initial encounter for closed fracture: Secondary | ICD-10-CM | POA: Diagnosis not present

## 2016-09-01 DIAGNOSIS — S32501A Unspecified fracture of right pubis, initial encounter for closed fracture: Secondary | ICD-10-CM | POA: Diagnosis not present

## 2016-09-01 DIAGNOSIS — E785 Hyperlipidemia, unspecified: Secondary | ICD-10-CM | POA: Diagnosis present

## 2016-09-01 DIAGNOSIS — M25521 Pain in right elbow: Secondary | ICD-10-CM | POA: Diagnosis not present

## 2016-09-01 DIAGNOSIS — K589 Irritable bowel syndrome without diarrhea: Secondary | ICD-10-CM | POA: Diagnosis present

## 2016-09-01 DIAGNOSIS — M6281 Muscle weakness (generalized): Secondary | ICD-10-CM | POA: Diagnosis not present

## 2016-09-01 DIAGNOSIS — R52 Pain, unspecified: Secondary | ICD-10-CM | POA: Diagnosis not present

## 2016-09-01 DIAGNOSIS — Z882 Allergy status to sulfonamides status: Secondary | ICD-10-CM | POA: Diagnosis not present

## 2016-09-01 DIAGNOSIS — Z87891 Personal history of nicotine dependence: Secondary | ICD-10-CM | POA: Diagnosis not present

## 2016-09-01 DIAGNOSIS — Z79899 Other long term (current) drug therapy: Secondary | ICD-10-CM | POA: Diagnosis not present

## 2016-09-01 DIAGNOSIS — W1839XA Other fall on same level, initial encounter: Secondary | ICD-10-CM | POA: Diagnosis not present

## 2016-09-01 DIAGNOSIS — R2689 Other abnormalities of gait and mobility: Secondary | ICD-10-CM | POA: Diagnosis not present

## 2016-09-01 DIAGNOSIS — Z823 Family history of stroke: Secondary | ICD-10-CM | POA: Diagnosis not present

## 2016-09-01 DIAGNOSIS — S79921A Unspecified injury of right thigh, initial encounter: Secondary | ICD-10-CM | POA: Diagnosis not present

## 2016-09-01 DIAGNOSIS — X58XXXD Exposure to other specified factors, subsequent encounter: Secondary | ICD-10-CM | POA: Diagnosis not present

## 2016-09-01 DIAGNOSIS — I69390 Apraxia following cerebral infarction: Secondary | ICD-10-CM | POA: Diagnosis not present

## 2016-09-01 DIAGNOSIS — R079 Chest pain, unspecified: Secondary | ICD-10-CM | POA: Diagnosis not present

## 2016-09-01 DIAGNOSIS — M79651 Pain in right thigh: Secondary | ICD-10-CM | POA: Diagnosis not present

## 2016-09-01 DIAGNOSIS — Z888 Allergy status to other drugs, medicaments and biological substances status: Secondary | ICD-10-CM | POA: Diagnosis not present

## 2016-09-06 DIAGNOSIS — I69390 Apraxia following cerebral infarction: Secondary | ICD-10-CM | POA: Diagnosis not present

## 2016-09-06 DIAGNOSIS — R42 Dizziness and giddiness: Secondary | ICD-10-CM | POA: Diagnosis not present

## 2016-09-06 DIAGNOSIS — W1839XA Other fall on same level, initial encounter: Secondary | ICD-10-CM | POA: Diagnosis not present

## 2016-09-06 DIAGNOSIS — S32591A Other specified fracture of right pubis, initial encounter for closed fracture: Secondary | ICD-10-CM | POA: Diagnosis not present

## 2016-09-06 DIAGNOSIS — R2689 Other abnormalities of gait and mobility: Secondary | ICD-10-CM | POA: Diagnosis not present

## 2016-09-06 DIAGNOSIS — E785 Hyperlipidemia, unspecified: Secondary | ICD-10-CM | POA: Diagnosis not present

## 2016-09-06 DIAGNOSIS — I69322 Dysarthria following cerebral infarction: Secondary | ICD-10-CM | POA: Diagnosis not present

## 2016-09-06 DIAGNOSIS — R52 Pain, unspecified: Secondary | ICD-10-CM | POA: Diagnosis not present

## 2016-09-06 DIAGNOSIS — K589 Irritable bowel syndrome without diarrhea: Secondary | ICD-10-CM | POA: Diagnosis not present

## 2016-09-06 DIAGNOSIS — E860 Dehydration: Secondary | ICD-10-CM | POA: Diagnosis not present

## 2016-09-06 DIAGNOSIS — R296 Repeated falls: Secondary | ICD-10-CM | POA: Diagnosis not present

## 2016-09-06 DIAGNOSIS — M6281 Muscle weakness (generalized): Secondary | ICD-10-CM | POA: Diagnosis not present

## 2016-09-06 DIAGNOSIS — Y92010 Kitchen of single-family (private) house as the place of occurrence of the external cause: Secondary | ICD-10-CM | POA: Diagnosis not present

## 2016-09-06 DIAGNOSIS — Z8673 Personal history of transient ischemic attack (TIA), and cerebral infarction without residual deficits: Secondary | ICD-10-CM | POA: Diagnosis not present

## 2016-09-06 DIAGNOSIS — X58XXXD Exposure to other specified factors, subsequent encounter: Secondary | ICD-10-CM | POA: Diagnosis not present

## 2016-09-06 DIAGNOSIS — S32591D Other specified fracture of right pubis, subsequent encounter for fracture with routine healing: Secondary | ICD-10-CM | POA: Diagnosis not present

## 2016-09-26 ENCOUNTER — Ambulatory Visit: Payer: Medicare Other | Admitting: Cardiology

## 2016-10-20 DIAGNOSIS — S32511D Fracture of superior rim of right pubis, subsequent encounter for fracture with routine healing: Secondary | ICD-10-CM | POA: Diagnosis not present

## 2016-10-20 DIAGNOSIS — I69390 Apraxia following cerebral infarction: Secondary | ICD-10-CM | POA: Diagnosis not present

## 2016-10-20 DIAGNOSIS — I69322 Dysarthria following cerebral infarction: Secondary | ICD-10-CM | POA: Diagnosis not present

## 2016-10-20 DIAGNOSIS — I10 Essential (primary) hypertension: Secondary | ICD-10-CM | POA: Diagnosis not present

## 2016-10-20 DIAGNOSIS — Z9181 History of falling: Secondary | ICD-10-CM | POA: Diagnosis not present

## 2016-10-20 DIAGNOSIS — K589 Irritable bowel syndrome without diarrhea: Secondary | ICD-10-CM | POA: Diagnosis not present

## 2016-10-20 DIAGNOSIS — S32591D Other specified fracture of right pubis, subsequent encounter for fracture with routine healing: Secondary | ICD-10-CM | POA: Diagnosis not present

## 2016-10-20 DIAGNOSIS — R42 Dizziness and giddiness: Secondary | ICD-10-CM | POA: Diagnosis not present

## 2016-10-21 DIAGNOSIS — S32511D Fracture of superior rim of right pubis, subsequent encounter for fracture with routine healing: Secondary | ICD-10-CM | POA: Diagnosis not present

## 2016-10-21 DIAGNOSIS — S32591D Other specified fracture of right pubis, subsequent encounter for fracture with routine healing: Secondary | ICD-10-CM | POA: Diagnosis not present

## 2016-10-21 DIAGNOSIS — I69322 Dysarthria following cerebral infarction: Secondary | ICD-10-CM | POA: Diagnosis not present

## 2016-10-21 DIAGNOSIS — I69390 Apraxia following cerebral infarction: Secondary | ICD-10-CM | POA: Diagnosis not present

## 2016-10-21 DIAGNOSIS — I10 Essential (primary) hypertension: Secondary | ICD-10-CM | POA: Diagnosis not present

## 2016-10-21 DIAGNOSIS — R42 Dizziness and giddiness: Secondary | ICD-10-CM | POA: Diagnosis not present

## 2016-10-23 DIAGNOSIS — S32511D Fracture of superior rim of right pubis, subsequent encounter for fracture with routine healing: Secondary | ICD-10-CM | POA: Diagnosis not present

## 2016-10-23 DIAGNOSIS — S32591D Other specified fracture of right pubis, subsequent encounter for fracture with routine healing: Secondary | ICD-10-CM | POA: Diagnosis not present

## 2016-10-23 DIAGNOSIS — R42 Dizziness and giddiness: Secondary | ICD-10-CM | POA: Diagnosis not present

## 2016-10-23 DIAGNOSIS — I69322 Dysarthria following cerebral infarction: Secondary | ICD-10-CM | POA: Diagnosis not present

## 2016-10-23 DIAGNOSIS — I10 Essential (primary) hypertension: Secondary | ICD-10-CM | POA: Diagnosis not present

## 2016-10-23 DIAGNOSIS — I69390 Apraxia following cerebral infarction: Secondary | ICD-10-CM | POA: Diagnosis not present

## 2016-10-24 NOTE — Addendum Note (Signed)
Addendum  created 10/24/16 1030 by Kalyn Hofstra D, MD   Sign clinical note    

## 2016-10-28 DIAGNOSIS — S32591D Other specified fracture of right pubis, subsequent encounter for fracture with routine healing: Secondary | ICD-10-CM | POA: Diagnosis not present

## 2016-10-28 DIAGNOSIS — I69390 Apraxia following cerebral infarction: Secondary | ICD-10-CM | POA: Diagnosis not present

## 2016-10-28 DIAGNOSIS — R42 Dizziness and giddiness: Secondary | ICD-10-CM | POA: Diagnosis not present

## 2016-10-28 DIAGNOSIS — I69322 Dysarthria following cerebral infarction: Secondary | ICD-10-CM | POA: Diagnosis not present

## 2016-10-28 DIAGNOSIS — I10 Essential (primary) hypertension: Secondary | ICD-10-CM | POA: Diagnosis not present

## 2016-10-28 DIAGNOSIS — S32511D Fracture of superior rim of right pubis, subsequent encounter for fracture with routine healing: Secondary | ICD-10-CM | POA: Diagnosis not present

## 2016-10-30 DIAGNOSIS — I10 Essential (primary) hypertension: Secondary | ICD-10-CM | POA: Diagnosis not present

## 2016-10-30 DIAGNOSIS — S32591D Other specified fracture of right pubis, subsequent encounter for fracture with routine healing: Secondary | ICD-10-CM | POA: Diagnosis not present

## 2016-10-30 DIAGNOSIS — S32511D Fracture of superior rim of right pubis, subsequent encounter for fracture with routine healing: Secondary | ICD-10-CM | POA: Diagnosis not present

## 2016-10-30 DIAGNOSIS — I69390 Apraxia following cerebral infarction: Secondary | ICD-10-CM | POA: Diagnosis not present

## 2016-10-30 DIAGNOSIS — R42 Dizziness and giddiness: Secondary | ICD-10-CM | POA: Diagnosis not present

## 2016-10-30 DIAGNOSIS — I69322 Dysarthria following cerebral infarction: Secondary | ICD-10-CM | POA: Diagnosis not present

## 2016-11-03 DIAGNOSIS — I69390 Apraxia following cerebral infarction: Secondary | ICD-10-CM | POA: Diagnosis not present

## 2016-11-03 DIAGNOSIS — I69322 Dysarthria following cerebral infarction: Secondary | ICD-10-CM | POA: Diagnosis not present

## 2016-11-03 DIAGNOSIS — I10 Essential (primary) hypertension: Secondary | ICD-10-CM | POA: Diagnosis not present

## 2016-11-03 DIAGNOSIS — S32591D Other specified fracture of right pubis, subsequent encounter for fracture with routine healing: Secondary | ICD-10-CM | POA: Diagnosis not present

## 2016-11-03 DIAGNOSIS — R42 Dizziness and giddiness: Secondary | ICD-10-CM | POA: Diagnosis not present

## 2016-11-03 DIAGNOSIS — S32511D Fracture of superior rim of right pubis, subsequent encounter for fracture with routine healing: Secondary | ICD-10-CM | POA: Diagnosis not present

## 2016-11-04 NOTE — Progress Notes (Signed)
Cardiology Office Note  Date: 11/06/2016   ID: REAGEN GOATES, DOB 09-07-1940, MRN [id number]  PCP: Neale Burly, MD  Primary Cardiologist: Rozann Lesches, MD   Chief Complaint  Patient presents with  . PSVT    History of Present Illness: Brandi [name] is a 76 y.o. female last seen in April 2017. She presents for a follow-up visit. She is somewhat overdue, states she had a fall a few months ago after turning suddenly in her kitchen, had a pelvic fracture that did not require surgery, although she was in rehabilitation for about 6 weeks. She states that she is back to baseline. It sounds like her fall was related to vertigo and loss of balance. She does not report any palpitations or chest pain otherwise remains on a stable cardiac regimen.  I reviewed her medications. She remains on low-dose metoprolol for treatment of previously documented brief PSVT. She has had no sustained palpitations or frank syncope.  Last echocardiogram was in 2014 as noted below.  Past Medical History:  Diagnosis Date  . Diverticulosis   . Essential hypertension, benign   . Irritable bowel syndrome   . Mitral regurgitation    Mild to moderate 09/2012  . Mixed hyperlipidemia   . PSVT (paroxysmal supraventricular tachycardia) ([place])    Brief by monitoring  . Sinus bradycardia   . Stroke [facility])    right sided weakness    Past Surgical History:  Procedure Laterality Date  . ABDOMINAL HYSTERECTOMY  06-02-2008  . CARPAL TUNNEL RELEASE Right 07/15/2016   Procedure: RIGHT CARPAL TUNNEL RELEASE;  Surgeon: Daryll Brod, MD;  Location: [facility];  Service: Orthopedics;  Laterality: Right;  . ORIF HIP FRACTURE      Current Outpatient Prescriptions  Medication Sig Dispense Refill  . aspirin 81 MG EC tablet Take 81 mg by mouth daily.      . Cholecalciferol (VITAMIN D) 2000 UNITS CAPS Take 2,000 Units by mouth daily.     . clopidogrel (PLAVIX) 75 MG tablet Take 75 mg by mouth daily.    Marland Kitchen  dicyclomine (BENTYL) 10 MG capsule Take 10 mg by mouth 2 (two) times daily.     . fish oil-omega-3 fatty acids 1000 MG capsule Take 1 g by mouth daily.     . hydrochlorothiazide (MICROZIDE) 12.5 MG capsule Take 1 capsule (12.5 mg total) by mouth daily. 90 capsule 3  . meclizine (ANTIVERT) 12.5 MG tablet Take 1 tablet (12.5 mg total) by mouth 3 (three) times daily as needed. 90 tablet 1  . metoprolol succinate (TOPROL-XL) 25 MG 24 hr tablet TAKE ONE-HALF TABLET BY MOUTH ONCE DAILY 45 tablet 3  . Multiple Vitamin (MULTIVITAMIN) tablet Take 1 tablet by mouth daily.    . pravastatin (PRAVACHOL) 20 MG tablet Take 1 tablet (20 mg total) by mouth daily. 30 tablet 5   No current facility-administered medications for this visit.    Allergies:  Sulfa antibiotics and Sudafed [pseudoephedrine hcl]   Social History: The patient  reports that she quit smoking about 18 years ago. Her smoking use included Cigarettes. She started smoking about 42 years ago. She has a 25.00 pack-year smoking history. She has never used smokeless tobacco. She reports that she does not drink alcohol or use drugs.   ROS:  Please see the history of present illness. Otherwise, complete review of systems is positive for chronic vertigo.  All other systems are reviewed and negative.   Physical Exam: VS:  BP 138/80  Pulse 66   Ht 5\' 4"  (1.626 m)   Wt 153 lb (69.4 kg)   BMI 26.26 kg/m , BMI Body mass index is 26.26 kg/m.  Wt Readings from Last 3 Encounters:  11/06/16 153 lb (69.4 kg)  07/15/16 151 lb 3.2 oz (68.6 kg)  09/03/15 153 lb 6.4 oz (69.6 kg)    Elderly woman, no distress.Marland Kitchen  HEENT: Conjunctiva and lids normal, oropharynx clear.  Neck: Supple, no elevated JVP or carotid bruits, no thyromegaly.  Lungs: Clear to auscultation, nonlabored breathing at rest.  Cardiac: Regular rate and rhythm, no S3 or significant systolic murmur, no pericardial rub.  Abdomen: Soft, nontender,bowel sounds present.  Extremities: No  pitting edema, distal pulses 2+.  ECG: I personally reviewed the tracing from 09/03/2015 which showed sinus rhythm.  Recent Labwork: 07/11/2016: BUN 15; Creatinine, Ser 0.78; Potassium 3.3; Sodium 138   Other Studies Reviewed Today:  Echocardiogram May 2014: LVEF 55-60% with grade 2 diastolic dysfunction, mild to moderate left atrial enlargement, atrial septal aneurysm, mild to moderate mitral regurgitation, PASP 35-40 mm mercury.  Assessment and Plan:  1. History of PSVT, no significant palpitations or frank syncope. Continue observation on Toprol-XL.  2. Essential hypertension, systolic blood pressure in the 130s today. No changes made to medical regimen.  Current medicines were reviewed with the patient today.  Disposition: Follow-up in one year, sooner if needed.  Signed, Satira Sark, MD, [facility] 11/06/2016 10:19 AM    [facility], [place], [place] [address] Phone: [phone]; Fax: [phone]

## 2016-11-06 ENCOUNTER — Ambulatory Visit (INDEPENDENT_AMBULATORY_CARE_PROVIDER_SITE_OTHER): Payer: Medicare Other | Admitting: Cardiology

## 2016-11-06 ENCOUNTER — Encounter: Payer: Self-pay | Admitting: *Deleted

## 2016-11-06 ENCOUNTER — Encounter: Payer: Self-pay | Admitting: Cardiology

## 2016-11-06 VITALS — BP 138/80 | HR 66 | Ht 64.0 in | Wt 153.0 lb

## 2016-11-06 DIAGNOSIS — I10 Essential (primary) hypertension: Secondary | ICD-10-CM | POA: Diagnosis not present

## 2016-11-06 DIAGNOSIS — I471 Supraventricular tachycardia: Secondary | ICD-10-CM

## 2016-11-06 NOTE — Patient Instructions (Signed)

## 2016-11-11 DIAGNOSIS — S72001D Fracture of unspecified part of neck of right femur, subsequent encounter for closed fracture with routine healing: Secondary | ICD-10-CM | POA: Diagnosis not present

## 2016-11-11 DIAGNOSIS — R102 Pelvic and perineal pain: Secondary | ICD-10-CM | POA: Diagnosis not present

## 2016-11-11 DIAGNOSIS — I6789 Other cerebrovascular disease: Secondary | ICD-10-CM | POA: Diagnosis not present

## 2016-11-11 DIAGNOSIS — E784 Other hyperlipidemia: Secondary | ICD-10-CM | POA: Diagnosis not present

## 2016-11-11 DIAGNOSIS — S72091A Other fracture of head and neck of right femur, initial encounter for closed fracture: Secondary | ICD-10-CM | POA: Diagnosis not present

## 2016-11-11 DIAGNOSIS — S32511D Fracture of superior rim of right pubis, subsequent encounter for fracture with routine healing: Secondary | ICD-10-CM | POA: Diagnosis not present

## 2016-11-11 DIAGNOSIS — I10 Essential (primary) hypertension: Secondary | ICD-10-CM | POA: Diagnosis not present

## 2016-11-11 DIAGNOSIS — S3282XA Multiple fractures of pelvis without disruption of pelvic ring, initial encounter for closed fracture: Secondary | ICD-10-CM | POA: Diagnosis not present

## 2017-02-12 DIAGNOSIS — Z1389 Encounter for screening for other disorder: Secondary | ICD-10-CM | POA: Diagnosis not present

## 2017-02-12 DIAGNOSIS — Z0000 Encounter for general adult medical examination without abnormal findings: Secondary | ICD-10-CM | POA: Diagnosis not present

## 2017-02-12 DIAGNOSIS — E784 Other hyperlipidemia: Secondary | ICD-10-CM | POA: Diagnosis not present

## 2017-02-12 DIAGNOSIS — I6789 Other cerebrovascular disease: Secondary | ICD-10-CM | POA: Diagnosis not present

## 2017-02-12 DIAGNOSIS — I10 Essential (primary) hypertension: Secondary | ICD-10-CM | POA: Diagnosis not present

## 2017-03-09 DIAGNOSIS — Z8781 Personal history of (healed) traumatic fracture: Secondary | ICD-10-CM | POA: Diagnosis not present

## 2017-03-09 DIAGNOSIS — G912 (Idiopathic) normal pressure hydrocephalus: Secondary | ICD-10-CM | POA: Diagnosis not present

## 2017-03-09 DIAGNOSIS — R42 Dizziness and giddiness: Secondary | ICD-10-CM | POA: Diagnosis not present

## 2017-03-09 DIAGNOSIS — Z9181 History of falling: Secondary | ICD-10-CM | POA: Diagnosis not present

## 2017-03-09 DIAGNOSIS — G459 Transient cerebral ischemic attack, unspecified: Secondary | ICD-10-CM | POA: Diagnosis not present

## 2017-03-09 DIAGNOSIS — R531 Weakness: Secondary | ICD-10-CM | POA: Diagnosis not present

## 2017-03-10 DIAGNOSIS — Z9181 History of falling: Secondary | ICD-10-CM | POA: Diagnosis not present

## 2017-03-10 DIAGNOSIS — G459 Transient cerebral ischemic attack, unspecified: Secondary | ICD-10-CM | POA: Diagnosis not present

## 2017-03-10 DIAGNOSIS — Z23 Encounter for immunization: Secondary | ICD-10-CM | POA: Diagnosis not present

## 2017-03-10 DIAGNOSIS — Z888 Allergy status to other drugs, medicaments and biological substances status: Secondary | ICD-10-CM | POA: Diagnosis not present

## 2017-03-10 DIAGNOSIS — E119 Type 2 diabetes mellitus without complications: Secondary | ICD-10-CM | POA: Diagnosis present

## 2017-03-10 DIAGNOSIS — Z87891 Personal history of nicotine dependence: Secondary | ICD-10-CM | POA: Diagnosis not present

## 2017-03-10 DIAGNOSIS — R531 Weakness: Secondary | ICD-10-CM | POA: Diagnosis not present

## 2017-03-10 DIAGNOSIS — R42 Dizziness and giddiness: Secondary | ICD-10-CM | POA: Diagnosis not present

## 2017-03-10 DIAGNOSIS — Z7902 Long term (current) use of antithrombotics/antiplatelets: Secondary | ICD-10-CM | POA: Diagnosis not present

## 2017-03-10 DIAGNOSIS — E785 Hyperlipidemia, unspecified: Secondary | ICD-10-CM | POA: Diagnosis present

## 2017-03-10 DIAGNOSIS — Z825 Family history of asthma and other chronic lower respiratory diseases: Secondary | ICD-10-CM | POA: Diagnosis not present

## 2017-03-10 DIAGNOSIS — Z8673 Personal history of transient ischemic attack (TIA), and cerebral infarction without residual deficits: Secondary | ICD-10-CM | POA: Diagnosis not present

## 2017-03-10 DIAGNOSIS — Z823 Family history of stroke: Secondary | ICD-10-CM | POA: Diagnosis not present

## 2017-03-10 DIAGNOSIS — G912 (Idiopathic) normal pressure hydrocephalus: Secondary | ICD-10-CM | POA: Diagnosis not present

## 2017-03-10 DIAGNOSIS — Z882 Allergy status to sulfonamides status: Secondary | ICD-10-CM | POA: Diagnosis not present

## 2017-03-10 DIAGNOSIS — Z8781 Personal history of (healed) traumatic fracture: Secondary | ICD-10-CM | POA: Diagnosis not present

## 2017-03-10 DIAGNOSIS — Z8249 Family history of ischemic heart disease and other diseases of the circulatory system: Secondary | ICD-10-CM | POA: Diagnosis not present

## 2017-03-10 DIAGNOSIS — Z7982 Long term (current) use of aspirin: Secondary | ICD-10-CM | POA: Diagnosis not present

## 2017-03-10 DIAGNOSIS — Z79899 Other long term (current) drug therapy: Secondary | ICD-10-CM | POA: Diagnosis not present

## 2017-03-16 ENCOUNTER — Other Ambulatory Visit: Payer: Self-pay | Admitting: Neurosurgery

## 2017-03-16 DIAGNOSIS — G912 (Idiopathic) normal pressure hydrocephalus: Secondary | ICD-10-CM | POA: Diagnosis not present

## 2017-03-17 ENCOUNTER — Encounter (HOSPITAL_COMMUNITY): Payer: Self-pay | Admitting: *Deleted

## 2017-03-17 NOTE — Progress Notes (Addendum)
Brandi [name] denies chest pain or shortness of breath.Patient was discharged from [facility] in [place] on 10/17 with instructions to hold Plavix until after surgery.  Patient's PCP orders Plavix.  I faxed a request for discharge notes.

## 2017-03-18 ENCOUNTER — Encounter (HOSPITAL_COMMUNITY): Payer: Self-pay | Admitting: *Deleted

## 2017-03-18 ENCOUNTER — Inpatient Hospital Stay (HOSPITAL_COMMUNITY)
Admission: RE | Admit: 2017-03-18 | Discharge: 2017-03-20 | DRG: 032 | Disposition: A | Discharge status: Home Health | Payer: Medicare Other | Source: Ambulatory Visit | Attending: Neurosurgery | Admitting: Neurosurgery

## 2017-03-18 ENCOUNTER — Ambulatory Visit (HOSPITAL_COMMUNITY): Payer: Medicare Other | Admitting: Anesthesiology

## 2017-03-18 ENCOUNTER — Encounter (HOSPITAL_COMMUNITY)
Admission: RE | Disposition: A | Discharge status: Home Health | Payer: Self-pay | Source: Ambulatory Visit | Attending: Neurosurgery

## 2017-03-18 DIAGNOSIS — Z881 Allergy status to other antibiotic agents status: Secondary | ICD-10-CM | POA: Diagnosis not present

## 2017-03-18 DIAGNOSIS — R29810 Facial weakness: Secondary | ICD-10-CM | POA: Diagnosis present

## 2017-03-18 DIAGNOSIS — Z882 Allergy status to sulfonamides status: Secondary | ICD-10-CM

## 2017-03-18 DIAGNOSIS — G912 (Idiopathic) normal pressure hydrocephalus: Principal | ICD-10-CM | POA: Diagnosis present

## 2017-03-18 DIAGNOSIS — Z8673 Personal history of transient ischemic attack (TIA), and cerebral infarction without residual deficits: Secondary | ICD-10-CM | POA: Diagnosis not present

## 2017-03-18 DIAGNOSIS — R471 Dysarthria and anarthria: Secondary | ICD-10-CM | POA: Diagnosis present

## 2017-03-18 DIAGNOSIS — I10 Essential (primary) hypertension: Secondary | ICD-10-CM | POA: Diagnosis not present

## 2017-03-18 DIAGNOSIS — Z87891 Personal history of nicotine dependence: Secondary | ICD-10-CM | POA: Diagnosis not present

## 2017-03-18 DIAGNOSIS — G8191 Hemiplegia, unspecified affecting right dominant side: Secondary | ICD-10-CM | POA: Diagnosis present

## 2017-03-18 DIAGNOSIS — G919 Hydrocephalus, unspecified: Secondary | ICD-10-CM | POA: Diagnosis present

## 2017-03-18 DIAGNOSIS — I471 Supraventricular tachycardia: Secondary | ICD-10-CM | POA: Diagnosis not present

## 2017-03-18 DIAGNOSIS — I493 Ventricular premature depolarization: Secondary | ICD-10-CM | POA: Diagnosis not present

## 2017-03-18 HISTORY — PX: VENTRICULOPERITONEAL SHUNT: SHX204

## 2017-03-18 LAB — BASIC METABOLIC PANEL
ANION GAP: 9 (ref 5–15)
BUN: 17 mg/dL (ref 6–20)
CO2: 24 mmol/L (ref 22–32)
Calcium: 9.3 mg/dL (ref 8.9–10.3)
Chloride: 105 mmol/L (ref 101–111)
Creatinine, Ser: 0.9 mg/dL (ref 0.44–1.00)
GFR calc Af Amer: >60 mL/min (ref >60)
GFR calc non Af Amer: 60 mL/min — ABNORMAL LOW (ref >60)
GLUCOSE: 96 mg/dL (ref 65–99)
POTASSIUM: 3.7 mmol/L (ref 3.5–5.1)
Sodium: 138 mmol/L (ref 135–145)

## 2017-03-18 LAB — CBC
HEMATOCRIT: 41.8 % (ref 36.0–46.0)
HEMOGLOBIN: 14.2 g/dL (ref 12.0–15.0)
MCH: 30.7 pg (ref 26.0–34.0)
MCHC: 34 g/dL (ref 30.0–36.0)
MCV: 90.3 fL (ref 78.0–100.0)
Platelets: 277 10*3/uL (ref 150–400)
RBC: 4.63 MIL/uL (ref 3.87–5.11)
RDW: 12.6 % (ref 11.5–15.5)
WBC: 7.2 10*3/uL (ref 4.0–10.5)

## 2017-03-18 LAB — MRSA PCR SCREENING: MRSA by PCR: NEGATIVE

## 2017-03-18 SURGERY — SHUNT INSERTION VENTRICULAR-PERITONEAL
Anesthesia: General | Site: Head

## 2017-03-18 MED ORDER — LABETALOL HCL 5 MG/ML IV SOLN: 10.0000 mg | INTRAVENOUS | Status: DC | PRN

## 2017-03-18 MED ORDER — PROMETHAZINE HCL 12.5 MG PO TABS
12.5000 mg | ORAL_TABLET | ORAL | Status: DC | PRN
  Filled 2017-03-18: qty 2

## 2017-03-18 MED ORDER — SUGAMMADEX SODIUM 200 MG/2ML IV SOLN
INTRAVENOUS | Status: DC | PRN
  Administered 2017-03-18: 200 mg via INTRAVENOUS

## 2017-03-18 MED ORDER — OMEGA-3 FATTY ACIDS 1000 MG PO CAPS: 1.0000 g | ORAL_CAPSULE | Freq: Every day | ORAL | Status: DC

## 2017-03-18 MED ORDER — DICYCLOMINE HCL 10 MG PO CAPS
10.0000 mg | ORAL_CAPSULE | Freq: Two times a day (BID) | ORAL | Status: DC
  Administered 2017-03-18 – 2017-03-20 (×4): 10 mg via ORAL
  Filled 2017-03-18 (×6): qty 1

## 2017-03-18 MED ORDER — PANTOPRAZOLE SODIUM 40 MG IV SOLR
40.0000 mg | Freq: Every day | INTRAVENOUS | Status: DC
  Administered 2017-03-18: 40 mg via INTRAVENOUS
  Filled 2017-03-18: qty 40

## 2017-03-18 MED ORDER — ADULT MULTIVITAMIN W/MINERALS CH
1.0000 | ORAL_TABLET | Freq: Every day | ORAL | Status: DC
  Administered 2017-03-18 – 2017-03-20 (×3): 1 via ORAL
  Filled 2017-03-18 (×3): qty 1

## 2017-03-18 MED ORDER — LIDOCAINE 2% (20 MG/ML) 5 ML SYRINGE
INTRAMUSCULAR | Status: AC
  Filled 2017-03-18: qty 5

## 2017-03-18 MED ORDER — POTASSIUM CHLORIDE IN NACL 20-0.9 MEQ/L-% IV SOLN
INTRAVENOUS | Status: DC
  Administered 2017-03-18: 18:00:00 via INTRAVENOUS
  Filled 2017-03-18 (×2): qty 1000

## 2017-03-18 MED ORDER — HYDROMORPHONE HCL 1 MG/ML IJ SOLN
INTRAMUSCULAR | Status: AC
  Filled 2017-03-18: qty 1

## 2017-03-18 MED ORDER — LIDOCAINE HCL (CARDIAC) 20 MG/ML IV SOLN
INTRAVENOUS | Status: DC | PRN
  Administered 2017-03-18: 60 mg via INTRAVENOUS

## 2017-03-18 MED ORDER — SUGAMMADEX SODIUM 200 MG/2ML IV SOLN
INTRAVENOUS | Status: AC
  Filled 2017-03-18: qty 2

## 2017-03-18 MED ORDER — PROPOFOL 10 MG/ML IV BOLUS
INTRAVENOUS | Status: AC
  Filled 2017-03-18: qty 20

## 2017-03-18 MED ORDER — ROCURONIUM BROMIDE 10 MG/ML (PF) SYRINGE
PREFILLED_SYRINGE | INTRAVENOUS | Status: AC
  Filled 2017-03-18: qty 5

## 2017-03-18 MED ORDER — MORPHINE SULFATE (PF) 4 MG/ML IV SOLN
1.0000 mg | INTRAVENOUS | Status: DC | PRN
  Administered 2017-03-18 – 2017-03-19 (×2): 1 mg via INTRAVENOUS
  Filled 2017-03-18 (×2): qty 1

## 2017-03-18 MED ORDER — MECLIZINE HCL 12.5 MG PO TABS
12.5000 mg | ORAL_TABLET | Freq: Three times a day (TID) | ORAL | Status: DC | PRN
  Filled 2017-03-18: qty 1

## 2017-03-18 MED ORDER — OXYCODONE HCL 5 MG PO TABS: 5.0000 mg | ORAL_TABLET | Freq: Once | ORAL | Status: DC | PRN

## 2017-03-18 MED ORDER — ACETAMINOPHEN 325 MG PO TABS: 650.0000 mg | ORAL_TABLET | ORAL | Status: DC | PRN

## 2017-03-18 MED ORDER — DEXAMETHASONE SODIUM PHOSPHATE 10 MG/ML IJ SOLN
INTRAMUSCULAR | Status: AC
  Filled 2017-03-18: qty 1

## 2017-03-18 MED ORDER — PRAVASTATIN SODIUM 20 MG PO TABS
20.0000 mg | ORAL_TABLET | Freq: Every day | ORAL | Status: DC
  Administered 2017-03-18 – 2017-03-20 (×3): 20 mg via ORAL
  Filled 2017-03-18 (×3): qty 1

## 2017-03-18 MED ORDER — LIDOCAINE-EPINEPHRINE 0.5 %-1:200000 IJ SOLN
INTRAMUSCULAR | Status: AC
  Filled 2017-03-18: qty 1

## 2017-03-18 MED ORDER — HYDROCODONE-ACETAMINOPHEN 5-325 MG PO TABS
1.0000 | ORAL_TABLET | ORAL | Status: DC | PRN
  Administered 2017-03-19 – 2017-03-20 (×4): 1 via ORAL
  Filled 2017-03-18 (×4): qty 1

## 2017-03-18 MED ORDER — NALOXONE HCL 0.4 MG/ML IJ SOLN: 0.0800 mg | INTRAMUSCULAR | Status: DC | PRN

## 2017-03-18 MED ORDER — ROCURONIUM BROMIDE 100 MG/10ML IV SOLN
INTRAVENOUS | Status: DC | PRN
  Administered 2017-03-18: 50 mg via INTRAVENOUS

## 2017-03-18 MED ORDER — EPHEDRINE 5 MG/ML INJ
INTRAVENOUS | Status: AC
  Filled 2017-03-18: qty 10

## 2017-03-18 MED ORDER — METOPROLOL SUCCINATE ER 25 MG PO TB24
12.5000 mg | ORAL_TABLET | Freq: Every day | ORAL | Status: DC
  Administered 2017-03-19 – 2017-03-20 (×2): 12.5 mg via ORAL
  Filled 2017-03-18 (×2): qty 1

## 2017-03-18 MED ORDER — VANCOMYCIN HCL 1000 MG IV SOLR
INTRAVENOUS | Status: AC
  Filled 2017-03-18: qty 1000

## 2017-03-18 MED ORDER — HYDROMORPHONE HCL 1 MG/ML IJ SOLN
0.2500 mg | INTRAMUSCULAR | Status: DC | PRN
  Administered 2017-03-18 (×2): 0.5 mg via INTRAVENOUS

## 2017-03-18 MED ORDER — ONDANSETRON HCL 4 MG/2ML IJ SOLN
INTRAMUSCULAR | Status: DC | PRN
  Administered 2017-03-18: 4 mg via INTRAVENOUS

## 2017-03-18 MED ORDER — HEMOSTATIC AGENTS (NO CHARGE) OPTIME
TOPICAL | Status: DC | PRN
Start: 2017-03-18 — End: 2017-03-18
  Administered 2017-03-18: 1 via TOPICAL

## 2017-03-18 MED ORDER — FENTANYL CITRATE (PF) 250 MCG/5ML IJ SOLN
INTRAMUSCULAR | Status: AC
  Filled 2017-03-18: qty 10

## 2017-03-18 MED ORDER — LACTATED RINGERS IV SOLN
INTRAVENOUS | Status: DC | PRN
  Administered 2017-03-18 (×2): via INTRAVENOUS

## 2017-03-18 MED ORDER — ONDANSETRON HCL 4 MG/2ML IJ SOLN
INTRAMUSCULAR | Status: AC
  Filled 2017-03-18: qty 2

## 2017-03-18 MED ORDER — FENTANYL CITRATE (PF) 100 MCG/2ML IJ SOLN
INTRAMUSCULAR | Status: DC | PRN
  Administered 2017-03-18 (×3): 50 ug via INTRAVENOUS

## 2017-03-18 MED ORDER — CHLORHEXIDINE GLUCONATE CLOTH 2 % EX PADS: 6.0000 | MEDICATED_PAD | Freq: Once | CUTANEOUS | Status: DC

## 2017-03-18 MED ORDER — ACETAMINOPHEN 650 MG RE SUPP: 650.0000 mg | RECTAL | Status: DC | PRN

## 2017-03-18 MED ORDER — THROMBIN 5000 UNITS EX SOLR
CUTANEOUS | Status: DC | PRN
  Administered 2017-03-18 (×2): 5000 [IU] via TOPICAL

## 2017-03-18 MED ORDER — EPHEDRINE SULFATE 50 MG/ML IJ SOLN
INTRAMUSCULAR | Status: DC | PRN
  Administered 2017-03-18: 5 mg via INTRAVENOUS
  Administered 2017-03-18: 10 mg via INTRAVENOUS
  Administered 2017-03-18: 15 mg via INTRAVENOUS
  Administered 2017-03-18: 5 mg via INTRAVENOUS
  Administered 2017-03-18: 10 mg via INTRAVENOUS
  Administered 2017-03-18: 15 mg via INTRAVENOUS

## 2017-03-18 MED ORDER — OXYCODONE HCL 5 MG/5ML PO SOLN: 5.0000 mg | Freq: Once | ORAL | Status: DC | PRN

## 2017-03-18 MED ORDER — BACITRACIN ZINC 500 UNIT/GM EX OINT
TOPICAL_OINTMENT | CUTANEOUS | Status: DC | PRN
  Administered 2017-03-18: 1 via TOPICAL

## 2017-03-18 MED ORDER — PROPOFOL 10 MG/ML IV BOLUS
INTRAVENOUS | Status: DC | PRN
  Administered 2017-03-18: 130 mg via INTRAVENOUS

## 2017-03-18 MED ORDER — 0.9 % SODIUM CHLORIDE (POUR BTL) OPTIME
TOPICAL | Status: DC | PRN
  Administered 2017-03-18: 1000 mL

## 2017-03-18 MED ORDER — ARTIFICIAL TEARS OPHTHALMIC OINT
TOPICAL_OINTMENT | OPHTHALMIC | Status: AC
  Filled 2017-03-18: qty 3.5

## 2017-03-18 MED ORDER — ARTIFICIAL TEARS OPHTHALMIC OINT
TOPICAL_OINTMENT | OPHTHALMIC | Status: DC | PRN
  Administered 2017-03-18: 1 via OPHTHALMIC

## 2017-03-18 MED ORDER — CEFAZOLIN SODIUM-DEXTROSE 2-4 GM/100ML-% IV SOLN
2.0000 g | INTRAVENOUS | Status: AC
  Administered 2017-03-18: 2 g via INTRAVENOUS
  Filled 2017-03-18: qty 100

## 2017-03-18 MED ORDER — CEFAZOLIN SODIUM-DEXTROSE 2-4 GM/100ML-% IV SOLN
INTRAVENOUS | Status: AC
  Filled 2017-03-18: qty 100

## 2017-03-18 MED ORDER — HYDROCHLOROTHIAZIDE 12.5 MG PO CAPS
12.5000 mg | ORAL_CAPSULE | Freq: Every day | ORAL | Status: DC
  Administered 2017-03-18 – 2017-03-20 (×3): 12.5 mg via ORAL
  Filled 2017-03-18 (×3): qty 1

## 2017-03-18 MED ORDER — BACITRACIN ZINC 500 UNIT/GM EX OINT
TOPICAL_OINTMENT | CUTANEOUS | Status: AC
  Filled 2017-03-18: qty 28.35

## 2017-03-18 MED ORDER — PROMETHAZINE HCL 25 MG/ML IJ SOLN: 6.2500 mg | INTRAMUSCULAR | Status: DC | PRN

## 2017-03-18 MED ORDER — OMEGA-3-ACID ETHYL ESTERS 1 G PO CAPS
1.0000 g | ORAL_CAPSULE | Freq: Every day | ORAL | Status: DC
  Administered 2017-03-18 – 2017-03-20 (×3): 1 g via ORAL
  Filled 2017-03-18 (×3): qty 1

## 2017-03-18 MED ORDER — ONDANSETRON HCL 4 MG PO TABS
4.0000 mg | ORAL_TABLET | ORAL | Status: DC | PRN
  Administered 2017-03-19: 4 mg via ORAL
  Filled 2017-03-18: qty 1

## 2017-03-18 MED ORDER — ONDANSETRON HCL 4 MG/2ML IJ SOLN: 4.0000 mg | INTRAMUSCULAR | Status: DC | PRN

## 2017-03-18 MED ORDER — CEFAZOLIN SODIUM-DEXTROSE 2-4 GM/100ML-% IV SOLN
2.0000 g | Freq: Three times a day (TID) | INTRAVENOUS | Status: AC
  Administered 2017-03-18 – 2017-03-19 (×2): 2 g via INTRAVENOUS
  Filled 2017-03-18: qty 100

## 2017-03-18 MED ORDER — LIDOCAINE 2% (20 MG/ML) 5 ML SYRINGE
INTRAMUSCULAR | Status: AC
  Filled 2017-03-18: qty 10

## 2017-03-18 MED ORDER — DEXAMETHASONE SODIUM PHOSPHATE 10 MG/ML IJ SOLN
INTRAMUSCULAR | Status: DC | PRN
  Administered 2017-03-18: 10 mg via INTRAVENOUS

## 2017-03-18 MED ORDER — CEFAZOLIN SODIUM-DEXTROSE 2-4 GM/100ML-% IV SOLN
INTRAVENOUS | Status: AC
  Administered 2017-03-18: 2 g via INTRAVENOUS
  Filled 2017-03-18: qty 100

## 2017-03-18 MED ORDER — LIDOCAINE-EPINEPHRINE 0.5 %-1:200000 IJ SOLN
INTRAMUSCULAR | Status: DC | PRN
  Administered 2017-03-18: 10 mL

## 2017-03-18 MED ORDER — VITAMIN D 1000 UNITS PO TABS
2000.0000 [IU] | ORAL_TABLET | Freq: Every day | ORAL | Status: DC
  Administered 2017-03-18 – 2017-03-20 (×3): 2000 [IU] via ORAL
  Filled 2017-03-18 (×3): qty 2

## 2017-03-18 SURGICAL SUPPLY — 67 items
ADH SKN CLS APL DERMABOND .7 (GAUZE/BANDAGES/DRESSINGS) ×1
BLADE CLIPPER SURG (BLADE) ×4 IMPLANT
BLADE SURG 11 STRL SS (BLADE) ×3 IMPLANT
BOOT SUTURE AID YELLOW STND (SUTURE) ×2 IMPLANT
BUR ACORN 6.0 PRECISION (BURR) ×2 IMPLANT
BUR ACORN 6.0MM PRECISION (BURR) ×1
CANISTER SUCT 3000ML PPV (MISCELLANEOUS) ×3 IMPLANT
CARTRIDGE OIL MAESTRO DRILL (MISCELLANEOUS) ×1 IMPLANT
CLIP RANEY DISP (INSTRUMENTS) IMPLANT
CLOSURE WOUND 1/4X4 (GAUZE/BANDAGES/DRESSINGS)
COVER BACK TABLE 60X90IN (DRAPES) IMPLANT
DECANTER SPIKE VIAL GLASS SM (MISCELLANEOUS) ×3 IMPLANT
DERMABOND ADVANCED (GAUZE/BANDAGES/DRESSINGS) ×2
DERMABOND ADVANCED .7 DNX12 (GAUZE/BANDAGES/DRESSINGS) ×1 IMPLANT
DIFFUSER DRILL AIR PNEUMATIC (MISCELLANEOUS) ×3 IMPLANT
DRAPE INCISE IOBAN 66X45 STRL (DRAPES) ×4 IMPLANT
DRAPE ORTHO SPLIT 77X108 STRL (DRAPES) ×6
DRAPE POUCH INSTRU U-SHP 10X18 (DRAPES) ×3 IMPLANT
DRAPE SURG ORHT 6 SPLT 77X108 (DRAPES) ×2 IMPLANT
DRSG OPSITE POSTOP 3X4 (GAUZE/BANDAGES/DRESSINGS) ×3 IMPLANT
DURAPREP 26ML APPLICATOR (WOUND CARE) ×6 IMPLANT
ELECT REM PT RETURN 9FT ADLT (ELECTROSURGICAL) ×3
ELECTRODE REM PT RTRN 9FT ADLT (ELECTROSURGICAL) ×1 IMPLANT
GAUZE SPONGE 4X4 16PLY XRAY LF (GAUZE/BANDAGES/DRESSINGS) IMPLANT
GLOVE BIOGEL PI IND STRL 6.5 (GLOVE) IMPLANT
GLOVE BIOGEL PI IND STRL 8 (GLOVE) IMPLANT
GLOVE BIOGEL PI INDICATOR 6.5 (GLOVE) ×2
GLOVE BIOGEL PI INDICATOR 8 (GLOVE) ×6
GLOVE ECLIPSE 6.5 STRL STRAW (GLOVE) ×5 IMPLANT
GLOVE ECLIPSE 7.5 STRL STRAW (GLOVE) ×8 IMPLANT
GLOVE EXAM NITRILE LRG STRL (GLOVE) IMPLANT
GLOVE EXAM NITRILE XL STR (GLOVE) IMPLANT
GLOVE EXAM NITRILE XS STR PU (GLOVE) IMPLANT
GLOVE SURG SS PI 6.0 STRL IVOR (GLOVE) ×6 IMPLANT
GOWN STRL REUS W/ TWL LRG LVL3 (GOWN DISPOSABLE) ×2 IMPLANT
GOWN STRL REUS W/ TWL XL LVL3 (GOWN DISPOSABLE) IMPLANT
GOWN STRL REUS W/TWL 2XL LVL3 (GOWN DISPOSABLE) ×3 IMPLANT
GOWN STRL REUS W/TWL LRG LVL3 (GOWN DISPOSABLE) ×6
GOWN STRL REUS W/TWL XL LVL3 (GOWN DISPOSABLE) ×3
HEMOSTAT SURGICEL 2X14 (HEMOSTASIS) IMPLANT
KIT BASIN OR (CUSTOM PROCEDURE TRAY) ×3 IMPLANT
KIT ROOM TURNOVER OR (KITS) ×3 IMPLANT
MARKER SKIN DUAL TIP RULER LAB (MISCELLANEOUS) ×1 IMPLANT
NEEDLE HYPO 25X1 1.5 SAFETY (NEEDLE) ×3 IMPLANT
NS IRRIG 1000ML POUR BTL (IV SOLUTION) ×3 IMPLANT
OIL CARTRIDGE MAESTRO DRILL (MISCELLANEOUS) ×3
PACK LAMINECTOMY NEURO (CUSTOM PROCEDURE TRAY) ×3 IMPLANT
PAD ARMBOARD 7.5X6 YLW CONV (MISCELLANEOUS) ×9 IMPLANT
SHEATH PERITONEAL INTRO 61 (MISCELLANEOUS) ×2 IMPLANT
SPONGE LAP 4X18 X RAY DECT (DISPOSABLE) IMPLANT
SPONGE SURGIFOAM ABS GEL 12-7 (HEMOSTASIS) IMPLANT
SPONGE SURGIFOAM ABS GEL SZ50 (HEMOSTASIS) ×2 IMPLANT
STAPLER SKIN PROX WIDE 3.9 (STAPLE) ×3 IMPLANT
STRIP CLOSURE SKIN 1/4X4 (GAUZE/BANDAGES/DRESSINGS) IMPLANT
SUT BONE WAX W31G (SUTURE) ×1 IMPLANT
SUT ETHILON 3 0 PS 1 (SUTURE) ×3 IMPLANT
SUT NURALON 4 0 TR CR/8 (SUTURE) IMPLANT
SUT SILK 0 TIES 10X30 (SUTURE) ×3 IMPLANT
SUT SILK 3 0 SH 30 (SUTURE) IMPLANT
SUT VIC AB 2-0 CT2 18 VCP726D (SUTURE) ×7 IMPLANT
SUT VIC AB 3-0 SH 8-18 (SUTURE) ×5 IMPLANT
SYR CONTROL 10ML LL (SYRINGE) ×1 IMPLANT
TOWEL GREEN STERILE (TOWEL DISPOSABLE) ×3 IMPLANT
TOWEL GREEN STERILE FF (TOWEL DISPOSABLE) ×3 IMPLANT
UNDERPAD 30X30 (UNDERPADS AND DIAPERS) ×3 IMPLANT
VALVE RT ANGLE UNITIZE DIST (Valve) ×2 IMPLANT
WATER STERILE IRR 1000ML POUR (IV SOLUTION) ×3 IMPLANT

## 2017-03-18 NOTE — H&P (Signed)
  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 She has had problems with her gait over the last few months. Says it has gotten worse over the last 5 days or so. She had an MRI which showed ventriculomegaly and some transependymal migration. It was thought that she might benefit from shunt placement. She is 76 years of age, retired. VITAL SIGNS: Height 5 feet 4 inches, weight 155 pounds. Temperature is 97.1, blood pressure is 148/77, pulse is 67. Pain is 0/10. PAST SURGICAL HISTORY: She has undergone an appendectomy, has had a broken hip repaired in 2004. PAST MEDICAL HISTORY: Includes hypertension and stroke. 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 ALLERGIES: She has allergies to Sulfa. She has intolerances to Sudafed and Sulfa-containing medications make her stomach sick. MEDICATIONS: She otherwise is taking the following medications: Aspirin, Vitamin D, Plavix, Bentyl, Fish Oil, Microzide, Antivert, Toprol, Multivitamin, and Pravachol.  Her balance and gait have been quite poor. She stopped the Plavix, taking the last pill last week on Tuesday.  She does take very small steps when she walks and states that is due to fear of falling. PHYSICAL EXAM: She is alert, oriented by 4, answering all questions appropriately. Facial asymmetry with a right-sided droop. 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 She has a positive Romberg test. She can tandem walk. She does take small shuffling steps, but when asked to take longer strides, she was able to do so quite well. She does use a walker when she does ambulate. Pupils are equal, round, and reactive to light. She has full extraocular movement and full visual fields. Memory, language, attention span, and fund of knowledge are normal.  Speech is markedly dysarthric. Symmetric facial sensation clear.  MRI is reviewed, shows ventriculomegaly. She still has very well demarcated sulci and gyri. She only has atrophy consistent with her age.  Given the fact she has significant ventriculomegaly and some transependymal migration, it is reasonable to think that placing a shunt might be helpful. I will do so and I will start her pressure at 120. I explained to the family that I will make increment changes as frequently as every 2 weeks, but not more frequent than that. Risks and benefits including bleeding, infection, no relief from the problem with her gait. They understand and would like to proceed. We will do so this coming Wednesday.

## 2017-03-18 NOTE — Op Note (Signed)
03/18/2017  9:27 AM  PATIENT:  Brandi [name]  76 y.o. female with NPH admitted for shunt creation  PRE-OPERATIVE DIAGNOSIS:  IDIOPATHIC NORMAL PRESSURE HYDROCEPHALUS  POST-OPERATIVE DIAGNOSIS:  IDIOPATHIC NORMAL PRESSURE HYDROCEPHALUS  PROCEDURE:  right Frontal ventriculoperitoneal shunt creation Codman-Hakim programmable valve set at 140mmH2O  SURGEON:  Surgeon(s): Ashok Pall, MD Jovita Gamma, MD  ASSISTANTS:Nudelman  ANESTHESIA:   General  EBL:  Total I/O In: 1000 [I.V.:1000] Out: -   COUNT:per nursing  SPECIMEN:  No Specimen  DICTATION: [name] was brought to the operating room intubated and placed under a general anesthetic without difficulty. She was positioned supine on the operating room table. Her head was shaved and prepped in a sterile manner. The neck and abdomen was also prepped and draped in a sterile manner. I infiltrated lidocaine into the abdomen, in the right upper quadrant just off the midline. I also infiltrated lidocaine into the planned scalp incision.  I opened the abdomen with a 10 blade and dissected through the subcutaneous tissue to the anterior rectus sheath. I opened the anterior rectus sheath with Metzenbaum scissors. I then bluntly divided the rectus muscle and retracted it with vicryl sutures, exposing the posterior rectus sheath. I opened the posterior rectus sheath with Metzenbaum scissors just enough to expose the peritoneum. I pulled the peritoneum up with straight snaps and secured and released it a few times to make sure no bowel would be harmed. I opened the peritoneum with the scissors and secured the edges with the snaps.  I then tunneled from the abdominal incision to the post auricular region. I opened the skin with a scalpel over the tunneler then brought the tunneler through the incision. I passed a silk tie from the post auricular incision to the abdominal incisioin, securing the ends with hemostats. I then opened the scalp  incision with a 10 blade. I created a burr hole with the drill and acorn attachment. I tunneled from the scalp incision to the post auricular incision using another silk tie. I brought the shunt into the operative field and using the ties pulled the distal end of the catheter out of the abdominal incision.  I opened the dura and placed the ventricular catheter into the lateral ventricle. We then cut the catheter and secured it to the integrated shunt. I observed good flow from the distal end of the catheter. At this time the distal catheter was placed into the peritoneum, and the abdominal incision was closed in layers using vicryl sutures. Dermabond was used for a sterile dressing. The cranial incisions were then closed by Dr. Sherwood Gambler with galeal sutures, and the scalp with staples. Sterile dressings were applied.   PLAN OF CARE: Admit to inpatient   PATIENT DISPOSITION:  PACU - hemodynamically stable.   Delay start of Pharmacological VTE agent (>24hrs) due to surgical blood loss or risk of bleeding:  yes

## 2017-03-18 NOTE — Anesthesia Postprocedure Evaluation (Signed)
Anesthesia Post Note  Patient: Brandi [name]  Procedure(s) Performed: SHUNT INSERTION VENTRICULAR-PERITONEAL (N/A Head)     Patient location during evaluation: PACU Anesthesia Type: General Level of consciousness: awake and alert Pain management: pain level controlled Vital Signs Assessment: post-procedure vital signs reviewed and stable Respiratory status: spontaneous breathing, nonlabored ventilation and respiratory function stable Cardiovascular status: blood pressure returned to baseline and stable Postop Assessment: no apparent nausea or vomiting Anesthetic complications: no    Last Vitals:  Vitals:   03/18/17 1112 03/18/17 1115  BP:  118/61  Pulse: 63   Resp: 16   Temp:    SpO2: 94%     Last Pain:  Vitals:   03/18/17 1112  TempSrc:   PainSc: [place]

## 2017-03-18 NOTE — Anesthesia Preprocedure Evaluation (Signed)
Anesthesia Evaluation  Patient identified by MRN, date of birth, ID band Patient awake    Reviewed: Allergy & Precautions, NPO status , Patient's Chart, lab work & pertinent test results, reviewed documented beta blocker date and time   Airway Mallampati: I  TM Distance: >3 FB Neck ROM: Full    Dental  (+) Teeth Intact, Dental Advisory Given   Pulmonary former smoker,    breath sounds clear to auscultation       Cardiovascular hypertension, Pt. on medications and Pt. on home beta blockers  Rhythm:Regular Rate:Normal     Neuro/Psych CVA negative psych ROS   GI/Hepatic negative GI ROS, Neg liver ROS,   Endo/Other  negative endocrine ROS  Renal/GU negative Renal ROS  negative genitourinary   Musculoskeletal negative musculoskeletal ROS (+)   Abdominal   Peds negative pediatric ROS (+)  Hematology negative hematology ROS (+)   Anesthesia Other Findings   Reproductive/Obstetrics negative OB ROS                             EKG: normal sinus rhythm.  Anesthesia Physical  Anesthesia Plan  ASA: III  Anesthesia Plan: General   Post-op Pain Management:    Induction: Intravenous  PONV Risk Score and Plan: 3 and Ondansetron, Dexamethasone and Midazolam  Airway Management Planned: Oral ETT  Additional Equipment:   Intra-op Plan:   Post-operative Plan:   Informed Consent: I have reviewed the patients History and Physical, chart, labs and discussed the procedure including the risks, benefits and alternatives for the proposed anesthesia with the patient or authorized representative who has indicated his/her understanding and acceptance.   Dental advisory given  Plan Discussed with: CRNA  Anesthesia Plan Comments:         Anesthesia Quick Evaluation

## 2017-03-18 NOTE — Progress Notes (Signed)
Per Dr. Christella Noa, patient can be sent to 4N ICU.

## 2017-03-18 NOTE — Transfer of Care (Signed)
Immediate Anesthesia Transfer of Care Note  Patient: Brandi [name]  Procedure(s) Performed: SHUNT INSERTION VENTRICULAR-PERITONEAL (N/A Head)  Patient Location: PACU  Anesthesia Type:General  Level of Consciousness: awake, alert , oriented and patient cooperative  Airway & Oxygen Therapy: Patient Spontanous Breathing and Patient connected to nasal cannula oxygen  Post-op Assessment: Report given to RN and Post -op Vital signs reviewed and stable  Post vital signs: Reviewed and stable  Last Vitals:  Vitals:   03/18/17 0606  BP: (!) 143/65  Pulse: (!) 55  Resp: 16  Temp: 36.6 C  SpO2: 97%    Last Pain:  Vitals:   03/18/17 0606  TempSrc: Oral      Patients Stated Pain Goal: 5 (94/17/40 8144)  Complications: No apparent anesthesia complications

## 2017-03-19 ENCOUNTER — Encounter (HOSPITAL_COMMUNITY): Payer: Self-pay

## 2017-03-19 MED ORDER — PANTOPRAZOLE SODIUM 40 MG PO TBEC
40.0000 mg | DELAYED_RELEASE_TABLET | Freq: Every day | ORAL | Status: DC
  Administered 2017-03-19: 40 mg via ORAL
  Filled 2017-03-19 (×2): qty 1

## 2017-03-19 NOTE — Progress Notes (Signed)
Patient ID: Brandi [name], female   DOB: 1940/09/30, 76 y.o.   MRN: [id number] BP (!) 126/58 (BP Location: Left Arm)   Pulse (!) 58   Temp 98.8 F (37.1 C) (Oral)   Resp 16   Ht 5\' 4"  (1.626 m)   Wt 70.3 kg (155 lb)   SpO2 96%   BMI 26.61 kg/m  Alert and oriented x 4 Speech is dysarthric, plegic on right side, right facial droop. All at baseline Wound is clean, dry, no signs of infection Nauseated while walking today, will hold on discharge.

## 2017-03-19 NOTE — Evaluation (Signed)
Physical Therapy Evaluation Patient Details Name: Brandi [name] MRN: [id number] DOB: [birth year] Today's Date: 03/19/2017   History of Present Illness  76 y.o. 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  Pt with significant PMH of stroke with residual R sided weakness and speech difficulty, sinus Loletha Grayer, PSVT, mitral regurgitation, essential HTN, and R hip fx s/p ORIF.  Clinical Impression  Pt was able to walk around the unit, she was a bit shaky for her first trip up and I do recommend she use her RW at home for now to steady herself.  She will have her sister at home with her who family reports can physically help if needed.   PT to follow acutely for deficits listed below.       Follow Up Recommendations Home health PT;Supervision for mobility/OOB    Equipment Recommendations  None recommended by PT    Recommendations for Other Services   NA    Precautions / Restrictions Precautions Precautions: Fall Precaution Comments: h/o falls      Mobility  Bed Mobility Overal bed mobility: Needs Assistance Bed Mobility: Supine to Sit     Supine to sit: Min guard     General bed mobility comments: Min guard assist for safety during transition to EOB on deflated  compliant mattress.   Transfers Overall transfer level: Needs assistance   Transfers: Sit to/from Stand Sit to Stand: Min guard         General transfer comment: Min guard assist for safety during transitions.  Verbal cues for safe RW use and safe hand placement during transitions.   Ambulation/Gait Ambulation/Gait assistance: Min assist Ambulation Distance (Feet): 130 Feet Assistive device: Rolling walker (2 wheeled) Gait Pattern/deviations: Step-through pattern;Shuffle     General Gait Details: Pt with mildly unsteady gait pattern even with RW.  Verbal cues for safe RW use, safe, controlled speed.          Balance Overall balance assessment: Needs assistance Sitting-balance  support: Feet supported;Bilateral upper extremity supported Sitting balance-Leahy Scale: Fair     Standing balance support: Bilateral upper extremity supported Standing balance-Leahy Scale: Fair                               Pertinent Vitals/Pain Pain Assessment: No/denies pain    Home Living Family/patient expects to be discharged to:: Private residence Living Arrangements: Alone Available Help at Discharge: Family;Available 24 hours/day (sister coming to stay with her) Type of Home: House Home Access: Stairs to enter Entrance Stairs-Rails: None Entrance Stairs-Number of Steps: 2 Home Layout: One level Home Equipment: Walker - 2 wheels;Cane - single point      Prior Function Level of Independence: Needs assistance   Gait / Transfers Assistance Needed: Pt jsut recently started to have to use a RW.           Hand Dominance   Dominant Hand: Right    Extremity/Trunk Assessment   Upper Extremity Assessment Upper Extremity Assessment: Overall WFL for tasks assessed    Lower Extremity Assessment Lower Extremity Assessment: Generalized weakness    Cervical / Trunk Assessment Cervical / Trunk Assessment: Normal  Communication   Communication: Expressive difficulties (dysarthric)  Cognition Arousal/Alertness: Awake/alert Behavior During Therapy: WFL for tasks assessed/performed Overall Cognitive Status: Within Functional Limits for tasks assessed  Assessment/Plan    PT Assessment Patient needs continued PT services  PT Problem List Decreased strength;Decreased activity tolerance;Decreased balance;Decreased mobility;Decreased knowledge of use of DME       PT Treatment Interventions DME instruction;Gait training;Stair training;Functional mobility training;Therapeutic activities;Balance training;Therapeutic exercise;Neuromuscular re-education;Cognitive remediation;Patient/family education     PT Goals (Current goals can be found in the Care Plan section)  Acute Rehab PT Goals Patient Stated Goal: to get better PT Goal Formulation: With patient Time For Goal Achievement: 04/02/17 Potential to Achieve Goals: Good    Frequency Min 4X/week    AM-PAC PT "6 Clicks" Daily Activity  Outcome Measure Difficulty turning over in bed (including adjusting bedclothes, sheets and blankets)?: A Little Difficulty moving from lying on back to sitting on the side of the bed? : A Little Difficulty sitting down on and standing up from a chair with arms (e.g., wheelchair, bedside commode, etc,.)?: Unable Help needed moving to and from a bed to chair (including a wheelchair)?: A Little Help needed walking in hospital room?: A Little Help needed climbing 3-5 steps with a railing? : A Little 6 Click Score: 16    End of Session Equipment Utilized During Treatment: Gait belt Activity Tolerance: Patient limited by fatigue Patient left: in chair;with call bell/phone within reach Nurse Communication: Mobility status PT Visit Diagnosis: Other abnormalities of gait and mobility (R26.89);Muscle weakness (generalized) (M62.81);History of falling (Z91.81);Difficulty in walking, not elsewhere classified (R26.2)    Time: 1937-9024 PT Time Calculation (min) (ACUTE ONLY): 25 min   Charges:   PT Evaluation $PT Eval Moderate Complexity: 1 Mod PT Treatments $Gait Training: 8-22 mins   Aikam Vinje B. Ainara Eldridge, PT, DPT [phone]   Harvie Heck 03/19/2017, 11:29 PM

## 2017-03-19 NOTE — Progress Notes (Addendum)
03/19/2017 PT eval complete, full note to follow.  Pt a bit unsteady on her feet requiring a RW for stability.  I would recommend HHPT f/u at discharge.  Her sister will be staying with her 24/7 and can provide physical assist and close supervision the first few days at home.    Thanks,  Barbarann Ehlers. [place], [place], DPT [phone]

## 2017-03-20 LAB — GLUCOSE, CAPILLARY: GLUCOSE-CAPILLARY: 90 mg/dL (ref 65–99)

## 2017-03-20 MED ORDER — ACETAMINOPHEN-CODEINE #3 300-30 MG PO TABS: 1.0000 | ORAL_TABLET | Freq: Four times a day (QID) | ORAL | 0 refills | Status: DC | PRN

## 2017-03-20 NOTE — Care Management Note (Signed)
Case Management Note  Patient Details  Name: Brandi [name] MRN: [id number] Date of Birth: April 12, 1941  Subjective/Objective:  Pt s/p VP shunt. She is from home alone but her sister is going to stay with her at discharge.                   Action/Plan: PT recommending [place] services. CM met with the patient and her son and provided a list of [facility] agencies. They selected [facility]. Santiago Glad with [facility] notified. Awaiting MD to place orders for [facility].  Son to provide transportation home today.  Expected Discharge Date:                  Expected Discharge Plan:  [place]  In-House Referral:     Discharge planning Services  CM Consult  Post Acute Care Choice:  Home Health Choice offered to:  Patient, Adult Children  DME Arranged:    DME Agency:     HH Arranged:  PT, Speech Therapy [place] Agency:  [facility]  Status of Service:  Completed, signed off  If discussed at [place] of Stay Meetings, dates discussed:    Additional Comments:  Pollie Friar, RN 03/20/2017, 4:13 PM

## 2017-03-20 NOTE — Discharge Instructions (Signed)
Craniotomy °Care After °Please read the instructions outlined below and refer to this sheet in the next few weeks. These discharge instructions provide you with general information on caring for yourself after you leave the hospital. Your surgeon may also give you specific instructions. While your treatment has been planned according to the most current medical practices available, unavoidable complications occasionally occur. If you have any problems or questions after discharge, please call your surgeon. °Although there are many types of brain surgery, recovery following craniotomy (surgical opening of the skull) is much the same for each. However, recovery depends on many factors. These include the type and severity of brain injury and the type of surgery. It also depends on any nervous system function problems (neurological deficits) before surgery. If the craniotomy was done for cancer, chemotherapy and radiation could follow. You could be in the hospital from 5 days to a couple weeks. This depends on the type of surgery, findings, and whether there are complications. °HOME CARE INSTRUCTIONS  °· It is not unusual to hear a clicking noise after a craniotomy, the plates and screws used to attach the bone flap can sometimes cause this. It is a normal occurrence if this does happen °· Do not drive for 10 days after the operation °· Your scalp may feel spongy for a while, because of fluid under it. This will gradually get better. Occasionally, the surgeon will not replace the bone that was removed to access the brain. If there is a bony defect, the surgeon will ask you to wear a helmet for protection. This is a discussion you should have with your surgeon prior to leaving the hospital (discharge). °· Numbness may persist in some areas of your scalp. °· Take all medications as directed. Sometimes steroids to control swelling are prescribed. Anticonvulsants to prevent seizures may also be given. Do not use alcohol,  other drugs, or medications unless your surgeon says it is OK. °· Keep the wound dry and clean. The wound may be washed gently with soap and water. Then, you may gently blot or dab it dry, without rubbing. Do not take baths, use swimming pools or hot tubs for 10 days, or as instructed by your caregiver. It is best to wait to see you surgeon at your first postoperative visit, and to get directions at that time. °· Only take over-the-counter or prescription medicines for pain, discomfort, or fever as directed by your caregiver. °· You may continue your normal diet, as directed. °· Walking is OK for exercise. Wait at least 3 months before you return to mild, non-contact sports or as your surgeon suggests. Contact sports should be avoided for at least 1 year, unless your surgeon says it is OK. °· If you are prescribed steroids, take them exactly as prescribed. If you start having a decrease in nervous system functions (neurological deficits) and headaches as the dose of steroids is reduced, tell your surgeon right away. °· When the anticonvulsant prescription is finished you no longer need to take it. °SEEK IMMEDIATE MEDICAL CARE IF:  °· You develop nausea, vomiting, severe headaches, confusion, or you have a seizure. °· You develop chest pain, a stiff neck, or difficulty breathing. °· There is redness, swelling, or increasing pain in the wound or pin insertion sites. °· You have an increase in swelling or bruising around the eyes. °· There is drainage or pus coming from the wound. °· You have an oral temperature above 102° F (38.9° C), not controlled by medicine. °·   You notice a foul smell coming from the wound or dressing. °· The wound breaks open (edges not staying together) after the stitches have been removed. °· You develop dizziness or fainting while standing. °· You develop a rash. °· You develop any reaction or side effects to the medications given. °Document Released: 08/12/2005 Document Revised: 08/04/2011  Document Reviewed: 05/21/2009 °Exit[employer]® Patient Information ©2013 [employer]. ° °

## 2017-03-20 NOTE — Progress Notes (Signed)
Pt discharge home for H/H/PT/OT accompanied with family in stable condition Discharge AVS and all instruction given and pt and daughter in law verbalized good understanding.

## 2017-03-20 NOTE — Progress Notes (Signed)
Physical Therapy Treatment Patient Details Name: Brandi [name] MRN: [id number] DOB: June 21, 1940 Today's Date: 03/20/2017    History of Present Illness 76 y.o. 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  Pt with significant PMH of stroke with residual R sided weakness and speech difficulty, sinus Loletha Grayer, PSVT, mitral regurgitation, essential HTN, and R hip fx s/p ORIF.    PT Comments    Pt is making good progress towards her goals. Pt states she feels much steadier with her walking and that walking did not make her nauseous today. Pt is currently minA for bed mobility, and min guard for transfers and ambulation of 350 feet with RW. Pt requires skilled PT in the acute setting to progress mobility and improve strength and endurance to safely navigate their discharge environment.    Follow Up Recommendations  Home health PT;Supervision for mobility/OOB     Equipment Recommendations  None recommended by PT       Precautions / Restrictions Precautions Precautions: Fall Precaution Comments: h/o falls Restrictions Weight Bearing Restrictions: No    Mobility  Bed Mobility Overal bed mobility: Needs Assistance Bed Mobility: Supine to Sit     Supine to sit: Min assist     General bed mobility comments: minA for coming to upright, vc for rolling onto side and pushing up   Transfers Overall transfer level: Needs assistance   Transfers: Sit to/from Stand Sit to Stand: Min guard         General transfer comment: Min guard assist for safety during transitions. good hand placement for power up and steadying in standing   Ambulation/Gait Ambulation/Gait assistance: Min guard Ambulation Distance (Feet): 350 Feet Assistive device: Rolling walker (2 wheeled) Gait Pattern/deviations: Step-through pattern;Shuffle Gait velocity: slowed Gait velocity interpretation: Below normal speed for age/gender General Gait Details: min guard for safety, pt reports feeling  much steadier today, slow, steady cadence, no LoB vc for staying inside RW      Balance Overall balance assessment: Needs assistance Sitting-balance support: Feet supported;Bilateral upper extremity supported Sitting balance-Leahy Scale: Good     Standing balance support: Bilateral upper extremity supported Standing balance-Leahy Scale: Fair                              Cognition Arousal/Alertness: Awake/alert Behavior During Therapy: WFL for tasks assessed/performed Overall Cognitive Status: Within Functional Limits for tasks assessed                                           General Comments General comments (skin integrity, edema, etc.): VSS throughout session       Pertinent Vitals/Pain Pain Assessment: No/denies pain           PT Goals (current goals can now be found in the care plan section) Acute Rehab PT Goals Patient Stated Goal: to get better PT Goal Formulation: With patient Time For Goal Achievement: 04/02/17 Potential to Achieve Goals: Good Progress towards PT goals: Progressing toward goals    Frequency    Min 4X/week      PT Plan Current plan remains appropriate       AM-PAC PT "6 Clicks" Daily Activity  Outcome Measure  Difficulty turning over in bed (including adjusting bedclothes, sheets and blankets)?: A Little Difficulty moving from lying on back to sitting on the side of the  bed? : A Little Difficulty sitting down on and standing up from a chair with arms (e.g., wheelchair, bedside commode, etc,.)?: Unable Help needed moving to and from a bed to chair (including a wheelchair)?: A Little Help needed walking in hospital room?: A Little Help needed climbing 3-5 steps with a railing? : A Little 6 Click Score: 16    End of Session Equipment Utilized During Treatment: Gait belt Activity Tolerance: Patient limited by fatigue Patient left: in chair;with call bell/phone within reach Nurse Communication:  Mobility status PT Visit Diagnosis: Other abnormalities of gait and mobility (R26.89);Muscle weakness (generalized) (M62.81);History of falling (Z91.81);Difficulty in walking, not elsewhere classified (R26.2)     Time: 2229-7989 PT Time Calculation (min) (ACUTE ONLY): 18 min  Charges:  $Gait Training: 8-22 mins                    G Codes:       Jacorian Golaszewski B. Migdalia Dk PT, DPT Acute Rehabilitation  [phone] Pager [phone]     [place] 03/20/2017, 3:39 PM

## 2017-03-20 NOTE — Discharge Summary (Signed)
  BP (!) 112/53 (BP Location: Left Arm)   Pulse (!) 55   Temp 98.4 F (36.9 C) (Oral)   Resp 20   Ht 5\' 4"  (1.626 m)   Wt 70.3 kg (155 lb)   SpO2 96%   BMI 26.61 kg/m  Physician Discharge Summary  Patient ID: Brandi [name] MRN: [id number] DOB/AGE: 78-Mar-1942 78 y.o.  Admit date: 03/18/2017 Discharge date: 03/20/2017  Admission Diagnoses:normal pressure hydrocephalus  Discharge Diagnoses:  Active Problems:   Normal pressure hydrocephalus   Hydrocephalus   Discharged Condition: good  Hospital Course: Brandi [name] was admitted and taken to the operating room for an uncomplicated right Ventriculoperitoneal shunt creation. Post op Brandi [name] is doing very well. Her wounds are clean, dry, and without signs of infection. She is at there neurologic baseline, with weakness on her right side, and dysarthria. She is alert and following all commands, walking with assistance.  Treatments: surgery: Right frontal ventriculoperitoneal shunt placement, set at 169mm h2o Codman hakim programmable valve.  Discharge Exam: Blood pressure (!) 112/53, pulse (!) 55, temperature 98.4 F (36.9 C), temperature source Oral, resp. rate 20, height 5\' 4"  (1.626 m), weight 70.3 kg (155 lb), SpO2 96 %. General appearance: alert, cooperative, appears stated age and no distress  Disposition: 01-Home or Self Care IDIOPATHIC NORMAL PRESSURE HYDROCEPHALUS  Allergies as of 03/20/2017      Reactions   Sulfa Antibiotics Anaphylaxis   Sudafed [pseudoephedrine Hcl] Nausea Only      Medication List    TAKE these medications   acetaminophen 500 MG tablet Commonly known as:  TYLENOL Take 500 mg by mouth daily as needed for moderate pain or headache.   acetaminophen-codeine 300-30 MG tablet Commonly known as:  TYLENOL #3 Take 1 tablet by mouth every 6 (six) hours as needed for moderate pain.   aspirin 81 MG EC tablet Take 81 mg by mouth daily.   clopidogrel 75 MG tablet Commonly known as:  PLAVIX Take  75 mg by mouth daily.   dicyclomine 10 MG capsule Commonly known as:  BENTYL Take 10 mg by mouth 2 (two) times daily.   fish oil-omega-3 fatty acids 1000 MG capsule Take 1 g by mouth daily.   hydrochlorothiazide 12.5 MG capsule Commonly known as:  MICROZIDE Take 1 capsule (12.5 mg total) by mouth daily.   meclizine 12.5 MG tablet Commonly known as:  ANTIVERT Take 1 tablet (12.5 mg total) by mouth 3 (three) times daily as needed. What changed:  reasons to take this   metoprolol succinate 25 MG 24 hr tablet Commonly known as:  TOPROL-XL TAKE ONE-HALF TABLET BY MOUTH ONCE DAILY   multivitamin tablet Take 1 tablet by mouth daily.   pravastatin 20 MG tablet Commonly known as:  PRAVACHOL Take 1 tablet (20 mg total) by mouth daily.   Vitamin D 2000 units Caps Take 2,000 Units by mouth daily.      Follow-up Information    Ashok Pall, MD Follow up in 10 day(s).   Specialty:  Neurosurgery Why:  please call the office to make an appointment for staple removal Contact information: [address] [place] [address] [phone]           Signed: Winfield Cunas 03/20/2017, 8:02 PM

## 2017-03-23 DIAGNOSIS — Z8781 Personal history of (healed) traumatic fracture: Secondary | ICD-10-CM | POA: Diagnosis not present

## 2017-03-23 DIAGNOSIS — Z7902 Long term (current) use of antithrombotics/antiplatelets: Secondary | ICD-10-CM | POA: Diagnosis not present

## 2017-03-23 DIAGNOSIS — I69322 Dysarthria following cerebral infarction: Secondary | ICD-10-CM | POA: Diagnosis not present

## 2017-03-23 DIAGNOSIS — G912 (Idiopathic) normal pressure hydrocephalus: Secondary | ICD-10-CM | POA: Diagnosis not present

## 2017-03-23 DIAGNOSIS — I69351 Hemiplegia and hemiparesis following cerebral infarction affecting right dominant side: Secondary | ICD-10-CM | POA: Diagnosis not present

## 2017-03-23 DIAGNOSIS — Z48811 Encounter for surgical aftercare following surgery on the nervous system: Secondary | ICD-10-CM | POA: Diagnosis not present

## 2017-03-23 DIAGNOSIS — I10 Essential (primary) hypertension: Secondary | ICD-10-CM | POA: Diagnosis not present

## 2017-03-23 DIAGNOSIS — Z982 Presence of cerebrospinal fluid drainage device: Secondary | ICD-10-CM | POA: Diagnosis not present

## 2017-03-25 DIAGNOSIS — Z982 Presence of cerebrospinal fluid drainage device: Secondary | ICD-10-CM | POA: Diagnosis not present

## 2017-03-25 DIAGNOSIS — I69351 Hemiplegia and hemiparesis following cerebral infarction affecting right dominant side: Secondary | ICD-10-CM | POA: Diagnosis not present

## 2017-03-25 DIAGNOSIS — I10 Essential (primary) hypertension: Secondary | ICD-10-CM | POA: Diagnosis not present

## 2017-03-25 DIAGNOSIS — Z48811 Encounter for surgical aftercare following surgery on the nervous system: Secondary | ICD-10-CM | POA: Diagnosis not present

## 2017-03-25 DIAGNOSIS — G912 (Idiopathic) normal pressure hydrocephalus: Secondary | ICD-10-CM | POA: Diagnosis not present

## 2017-03-25 DIAGNOSIS — I69322 Dysarthria following cerebral infarction: Secondary | ICD-10-CM | POA: Diagnosis not present

## 2017-03-27 DIAGNOSIS — I69322 Dysarthria following cerebral infarction: Secondary | ICD-10-CM | POA: Diagnosis not present

## 2017-03-27 DIAGNOSIS — Z48811 Encounter for surgical aftercare following surgery on the nervous system: Secondary | ICD-10-CM | POA: Diagnosis not present

## 2017-03-27 DIAGNOSIS — I69351 Hemiplegia and hemiparesis following cerebral infarction affecting right dominant side: Secondary | ICD-10-CM | POA: Diagnosis not present

## 2017-03-27 DIAGNOSIS — Z982 Presence of cerebrospinal fluid drainage device: Secondary | ICD-10-CM | POA: Diagnosis not present

## 2017-03-27 DIAGNOSIS — I10 Essential (primary) hypertension: Secondary | ICD-10-CM | POA: Diagnosis not present

## 2017-03-27 DIAGNOSIS — G912 (Idiopathic) normal pressure hydrocephalus: Secondary | ICD-10-CM | POA: Diagnosis not present

## 2017-03-30 DIAGNOSIS — I10 Essential (primary) hypertension: Secondary | ICD-10-CM | POA: Diagnosis not present

## 2017-03-30 DIAGNOSIS — Z982 Presence of cerebrospinal fluid drainage device: Secondary | ICD-10-CM | POA: Diagnosis not present

## 2017-03-30 DIAGNOSIS — G912 (Idiopathic) normal pressure hydrocephalus: Secondary | ICD-10-CM | POA: Diagnosis not present

## 2017-03-30 DIAGNOSIS — Z48811 Encounter for surgical aftercare following surgery on the nervous system: Secondary | ICD-10-CM | POA: Diagnosis not present

## 2017-03-30 DIAGNOSIS — I69351 Hemiplegia and hemiparesis following cerebral infarction affecting right dominant side: Secondary | ICD-10-CM | POA: Diagnosis not present

## 2017-03-30 DIAGNOSIS — I69322 Dysarthria following cerebral infarction: Secondary | ICD-10-CM | POA: Diagnosis not present

## 2017-04-01 DIAGNOSIS — Z982 Presence of cerebrospinal fluid drainage device: Secondary | ICD-10-CM | POA: Diagnosis not present

## 2017-04-01 DIAGNOSIS — G912 (Idiopathic) normal pressure hydrocephalus: Secondary | ICD-10-CM | POA: Diagnosis not present

## 2017-04-01 DIAGNOSIS — I69322 Dysarthria following cerebral infarction: Secondary | ICD-10-CM | POA: Diagnosis not present

## 2017-04-01 DIAGNOSIS — Z48811 Encounter for surgical aftercare following surgery on the nervous system: Secondary | ICD-10-CM | POA: Diagnosis not present

## 2017-04-01 DIAGNOSIS — I69351 Hemiplegia and hemiparesis following cerebral infarction affecting right dominant side: Secondary | ICD-10-CM | POA: Diagnosis not present

## 2017-04-01 DIAGNOSIS — I10 Essential (primary) hypertension: Secondary | ICD-10-CM | POA: Diagnosis not present

## 2017-04-02 DIAGNOSIS — I10 Essential (primary) hypertension: Secondary | ICD-10-CM | POA: Diagnosis not present

## 2017-04-02 DIAGNOSIS — Z982 Presence of cerebrospinal fluid drainage device: Secondary | ICD-10-CM | POA: Diagnosis not present

## 2017-04-02 DIAGNOSIS — I69322 Dysarthria following cerebral infarction: Secondary | ICD-10-CM | POA: Diagnosis not present

## 2017-04-02 DIAGNOSIS — Z48811 Encounter for surgical aftercare following surgery on the nervous system: Secondary | ICD-10-CM | POA: Diagnosis not present

## 2017-04-02 DIAGNOSIS — I69351 Hemiplegia and hemiparesis following cerebral infarction affecting right dominant side: Secondary | ICD-10-CM | POA: Diagnosis not present

## 2017-04-02 DIAGNOSIS — G912 (Idiopathic) normal pressure hydrocephalus: Secondary | ICD-10-CM | POA: Diagnosis not present

## 2017-04-03 DIAGNOSIS — G912 (Idiopathic) normal pressure hydrocephalus: Secondary | ICD-10-CM | POA: Diagnosis not present

## 2017-04-03 DIAGNOSIS — I69351 Hemiplegia and hemiparesis following cerebral infarction affecting right dominant side: Secondary | ICD-10-CM | POA: Diagnosis not present

## 2017-04-03 DIAGNOSIS — I69322 Dysarthria following cerebral infarction: Secondary | ICD-10-CM | POA: Diagnosis not present

## 2017-04-03 DIAGNOSIS — I10 Essential (primary) hypertension: Secondary | ICD-10-CM | POA: Diagnosis not present

## 2017-04-03 DIAGNOSIS — Z982 Presence of cerebrospinal fluid drainage device: Secondary | ICD-10-CM | POA: Diagnosis not present

## 2017-04-03 DIAGNOSIS — Z48811 Encounter for surgical aftercare following surgery on the nervous system: Secondary | ICD-10-CM | POA: Diagnosis not present

## 2017-04-07 DIAGNOSIS — I10 Essential (primary) hypertension: Secondary | ICD-10-CM | POA: Diagnosis not present

## 2017-04-07 DIAGNOSIS — I69322 Dysarthria following cerebral infarction: Secondary | ICD-10-CM | POA: Diagnosis not present

## 2017-04-07 DIAGNOSIS — I69351 Hemiplegia and hemiparesis following cerebral infarction affecting right dominant side: Secondary | ICD-10-CM | POA: Diagnosis not present

## 2017-04-07 DIAGNOSIS — Z48811 Encounter for surgical aftercare following surgery on the nervous system: Secondary | ICD-10-CM | POA: Diagnosis not present

## 2017-04-07 DIAGNOSIS — Z982 Presence of cerebrospinal fluid drainage device: Secondary | ICD-10-CM | POA: Diagnosis not present

## 2017-04-07 DIAGNOSIS — G912 (Idiopathic) normal pressure hydrocephalus: Secondary | ICD-10-CM | POA: Diagnosis not present

## 2017-04-08 DIAGNOSIS — I69322 Dysarthria following cerebral infarction: Secondary | ICD-10-CM | POA: Diagnosis not present

## 2017-04-08 DIAGNOSIS — G912 (Idiopathic) normal pressure hydrocephalus: Secondary | ICD-10-CM | POA: Diagnosis not present

## 2017-04-08 DIAGNOSIS — I10 Essential (primary) hypertension: Secondary | ICD-10-CM | POA: Diagnosis not present

## 2017-04-08 DIAGNOSIS — Z48811 Encounter for surgical aftercare following surgery on the nervous system: Secondary | ICD-10-CM | POA: Diagnosis not present

## 2017-04-08 DIAGNOSIS — I69351 Hemiplegia and hemiparesis following cerebral infarction affecting right dominant side: Secondary | ICD-10-CM | POA: Diagnosis not present

## 2017-04-08 DIAGNOSIS — Z982 Presence of cerebrospinal fluid drainage device: Secondary | ICD-10-CM | POA: Diagnosis not present

## 2017-04-09 DIAGNOSIS — I69351 Hemiplegia and hemiparesis following cerebral infarction affecting right dominant side: Secondary | ICD-10-CM | POA: Diagnosis not present

## 2017-04-09 DIAGNOSIS — I10 Essential (primary) hypertension: Secondary | ICD-10-CM | POA: Diagnosis not present

## 2017-04-09 DIAGNOSIS — Z982 Presence of cerebrospinal fluid drainage device: Secondary | ICD-10-CM | POA: Diagnosis not present

## 2017-04-09 DIAGNOSIS — G912 (Idiopathic) normal pressure hydrocephalus: Secondary | ICD-10-CM | POA: Diagnosis not present

## 2017-04-09 DIAGNOSIS — I69322 Dysarthria following cerebral infarction: Secondary | ICD-10-CM | POA: Diagnosis not present

## 2017-04-09 DIAGNOSIS — Z48811 Encounter for surgical aftercare following surgery on the nervous system: Secondary | ICD-10-CM | POA: Diagnosis not present

## 2017-04-10 DIAGNOSIS — Z48811 Encounter for surgical aftercare following surgery on the nervous system: Secondary | ICD-10-CM | POA: Diagnosis not present

## 2017-04-10 DIAGNOSIS — G912 (Idiopathic) normal pressure hydrocephalus: Secondary | ICD-10-CM | POA: Diagnosis not present

## 2017-04-10 DIAGNOSIS — I69351 Hemiplegia and hemiparesis following cerebral infarction affecting right dominant side: Secondary | ICD-10-CM | POA: Diagnosis not present

## 2017-04-10 DIAGNOSIS — Z982 Presence of cerebrospinal fluid drainage device: Secondary | ICD-10-CM | POA: Diagnosis not present

## 2017-04-10 DIAGNOSIS — I69322 Dysarthria following cerebral infarction: Secondary | ICD-10-CM | POA: Diagnosis not present

## 2017-04-10 DIAGNOSIS — I10 Essential (primary) hypertension: Secondary | ICD-10-CM | POA: Diagnosis not present

## 2017-05-12 DIAGNOSIS — H1850 Unspecified hereditary corneal dystrophies: Secondary | ICD-10-CM | POA: Diagnosis not present

## 2017-05-12 DIAGNOSIS — H04123 Dry eye syndrome of bilateral lacrimal glands: Secondary | ICD-10-CM | POA: Diagnosis not present

## 2017-05-12 DIAGNOSIS — H43813 Vitreous degeneration, bilateral: Secondary | ICD-10-CM | POA: Diagnosis not present

## 2017-05-12 DIAGNOSIS — Z961 Presence of intraocular lens: Secondary | ICD-10-CM | POA: Diagnosis not present

## 2017-05-14 DIAGNOSIS — I69823 Fluency disorder following other cerebrovascular disease: Secondary | ICD-10-CM | POA: Diagnosis not present

## 2017-05-14 DIAGNOSIS — E7849 Other hyperlipidemia: Secondary | ICD-10-CM | POA: Diagnosis not present

## 2017-05-14 DIAGNOSIS — G914 Hydrocephalus in diseases classified elsewhere: Secondary | ICD-10-CM | POA: Diagnosis not present

## 2017-06-15 DIAGNOSIS — M85832 Other specified disorders of bone density and structure, left forearm: Secondary | ICD-10-CM | POA: Diagnosis not present

## 2017-06-15 DIAGNOSIS — M810 Age-related osteoporosis without current pathological fracture: Secondary | ICD-10-CM | POA: Diagnosis not present

## 2017-06-30 DIAGNOSIS — M810 Age-related osteoporosis without current pathological fracture: Secondary | ICD-10-CM | POA: Diagnosis not present

## 2017-08-13 DIAGNOSIS — K581 Irritable bowel syndrome with constipation: Secondary | ICD-10-CM | POA: Diagnosis not present

## 2017-08-13 DIAGNOSIS — I69823 Fluency disorder following other cerebrovascular disease: Secondary | ICD-10-CM | POA: Diagnosis not present

## 2017-08-13 DIAGNOSIS — E7849 Other hyperlipidemia: Secondary | ICD-10-CM | POA: Diagnosis not present

## 2017-08-13 DIAGNOSIS — I10 Essential (primary) hypertension: Secondary | ICD-10-CM | POA: Diagnosis not present

## 2017-08-13 DIAGNOSIS — G914 Hydrocephalus in diseases classified elsewhere: Secondary | ICD-10-CM | POA: Diagnosis not present

## 2017-08-13 DIAGNOSIS — I688 Other cerebrovascular disorders in diseases classified elsewhere: Secondary | ICD-10-CM | POA: Diagnosis not present

## 2017-09-28 DIAGNOSIS — G912 (Idiopathic) normal pressure hydrocephalus: Secondary | ICD-10-CM | POA: Diagnosis not present

## 2017-10-20 DIAGNOSIS — F331 Major depressive disorder, recurrent, moderate: Secondary | ICD-10-CM | POA: Diagnosis not present

## 2017-10-20 DIAGNOSIS — S8001XA Contusion of right knee, initial encounter: Secondary | ICD-10-CM | POA: Diagnosis not present

## 2017-11-23 DIAGNOSIS — F331 Major depressive disorder, recurrent, moderate: Secondary | ICD-10-CM | POA: Diagnosis not present

## 2017-11-23 DIAGNOSIS — S8001XS Contusion of right knee, sequela: Secondary | ICD-10-CM | POA: Diagnosis not present

## 2017-11-23 DIAGNOSIS — I10 Essential (primary) hypertension: Secondary | ICD-10-CM | POA: Diagnosis not present

## 2017-11-24 NOTE — Progress Notes (Signed)
Cardiology Office Note  Date: 11/27/2017   ID: Brandi [name], DOB 23-Oct-1940, MRN [id number]  PCP: Neale Burly, MD  Primary Cardiologist: Rozann Lesches, MD   Chief Complaint  Patient presents with  . PSVT    History of Present Illness: Brandi [name] is a 77 y.o. female last seen in June 2018.  She presents for a routine follow-up visit.  From a cardiac perspective, she reports no palpitations, dizziness, or syncope.  She states that she has been compliant with her medications.  I went over her current medication list.  She is on Toprol-XL 25 mg daily.  I personally reviewed her ECG today which shows a sinus bradycardia.  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  Past Medical History:  Diagnosis Date  . Diverticulosis   . Essential hypertension, benign   . Irritable bowel syndrome   . Mitral regurgitation    Mild to moderate 09/2012  . Mixed hyperlipidemia   . PSVT (paroxysmal supraventricular tachycardia) ([place])    Brief by monitoring  . Sinus bradycardia   . Stroke [facility]) 10/2013   a Little Right sided weakness- speech a little "off' - "writing bad"    Past Surgical History:  Procedure Laterality Date  . ABDOMINAL HYSTERECTOMY  06-02-2008  . APPENDECTOMY  12/14/1949  . CARPAL TUNNEL RELEASE Right 07/15/2016   Procedure: RIGHT CARPAL TUNNEL RELEASE;  Surgeon: Daryll Brod, MD;  Location: [place];  Service: Orthopedics;  Laterality: Right;  . CATARACT EXTRACTION W/ INTRAOCULAR LENS IMPLANT    . COLONOSCOPY    . EYE SURGERY     cataract  . ORIF HIP FRACTURE Right 04/01/2003  . VENTRICULOPERITONEAL SHUNT N/A 03/18/2017   Procedure: SHUNT INSERTION VENTRICULAR-PERITONEAL;  Surgeon: Ashok Pall, MD;  Location: [place];  Service: Neurosurgery;  Laterality: N/A;    Current Outpatient Medications  Medication Sig Dispense Refill  . acetaminophen (TYLENOL) 500 MG  tablet Take 500 mg by mouth daily as needed for moderate pain or headache.    Marland Kitchen acetaminophen-codeine (TYLENOL #3) 300-30 MG tablet Take 1 tablet by mouth every 6 (six) hours as needed for moderate pain. 30 tablet 0  . aspirin 81 MG EC tablet Take 81 mg by mouth daily.      . Cholecalciferol (VITAMIN D) 2000 UNITS CAPS Take 2,000 Units by mouth daily.     . clopidogrel (PLAVIX) 75 MG tablet Take 75 mg by mouth daily.    Marland Kitchen dicyclomine (BENTYL) 10 MG capsule Take 10 mg by mouth 2 (two) times daily.     . fish oil-omega-3 fatty acids 1000 MG capsule Take 1 g by mouth daily.     . hydrochlorothiazide (MICROZIDE) 12.5 MG capsule Take 1 capsule (12.5 mg total) by mouth daily. 90 capsule 3  . meclizine (ANTIVERT) 12.5 MG tablet Take 1 tablet (12.5 mg total) by mouth 3 (three) times daily as needed. (Patient taking differently: Take 12.5 mg by mouth 3 (three) times daily as needed for dizziness. ) 90 tablet 1  . metoprolol succinate (TOPROL-XL) 25 MG 24 hr tablet Take 25 mg by mouth daily.    . Multiple Vitamin (MULTIVITAMIN) tablet Take 1 tablet by mouth daily.    . pravastatin (PRAVACHOL) 20 MG tablet Take 1 tablet (20 mg total) by mouth daily. 30 tablet 5  . sertraline (ZOLOFT) 50 MG tablet Take 50 mg by mouth daily.  0  . zolpidem (AMBIEN) 5  MG tablet Take 5 mg by mouth at bedtime as needed.  0   No current facility-administered medications for this visit.    Allergies:  Sulfa antibiotics and Sudafed [pseudoephedrine hcl]   Social History: The patient  reports that she quit smoking about 19 years ago. Her smoking use included cigarettes. She started smoking about 43 years ago. She has a 25.00 pack-year smoking history. She has never used smokeless tobacco. She reports that she does not drink alcohol or use drugs.   ROS:  Please see the history of present illness. Otherwise, complete review of systems is positive for none.  All other systems are reviewed and negative.   Physical Exam: VS:  BP (!)  150/80   Pulse (!) 54   Ht 5\' 4"  (1.626 m)   Wt 156 lb 6.4 oz (70.9 kg)   SpO2 98% Comment: on room air  BMI 26.85 kg/m , BMI Body mass index is 26.85 kg/m.  Wt Readings from Last 3 Encounters:  11/27/17 156 lb 6.4 oz (70.9 kg)  03/18/17 155 lb (70.3 kg)  11/06/16 153 lb (69.4 kg)    General: Patient appears comfortable at rest. HEENT: Conjunctiva and lids normal, oropharynx clear. Neck: Supple, no elevated JVP or carotid bruits, no thyromegaly. Lungs: Clear to auscultation, nonlabored breathing at rest. Cardiac: Regular rate and rhythm, no S3 or significant systolic murmur. Abdomen: Soft, nontender, no hepatomegaly, bowel sounds present. Extremities: No pitting edema, distal pulses 2+.  ECG: I personally reviewed the tracing from 08/31/2016 which showed sinus rhythm with nonspecific ST changes.  Recent Labwork: 03/18/2017: BUN 17; Creatinine, Ser 0.90; Hemoglobin 14.2; Platelets 277; Potassium 3.7; Sodium 138     Component Value Date/Time   CHOL 228 (H) 09/08/2013 1147   CHOL 154 08/24/2012 1212   TRIG 218 (H) 09/08/2013 1147   TRIG 131 08/24/2012 1212   HDL 71 09/08/2013 1147   HDL 68 08/24/2012 1212   LDLCALC 113 (H) 09/08/2013 1147   LDLCALC 60 08/24/2012 1212    Other Studies Reviewed Today:  Echocardiogram May 2014: LVEF 55-60% with grade 2 diastolic dysfunction, mild to moderate left atrial enlargement, atrial septal aneurysm, mild to moderate mitral regurgitation, PASP 35-40 mm mercury.  Assessment and Plan:  1.  History of PSVT.  She has had no symptoms on low-dose beta-blocker and we will continue with observation.  ECG reviewed.  2.  Essential hypertension, following with Dr. Sherrie Sport.  She is also on hydrochlorothiazide.  Current medicines were reviewed with the patient today.   Orders Placed This Encounter  Procedures  . EKG 12-Lead    Disposition: Follow-up in 1 year.  Signed, Satira Sark, MD, [facility] 11/27/2017 10:24 AM    [facility] [place], [place], [place] [address] Phone: [phone]; Fax: [phone]

## 2017-11-27 ENCOUNTER — Encounter: Payer: Self-pay | Admitting: Cardiology

## 2017-11-27 ENCOUNTER — Ambulatory Visit (INDEPENDENT_AMBULATORY_CARE_PROVIDER_SITE_OTHER): Payer: Medicare Other | Admitting: Cardiology

## 2017-11-27 VITALS — BP 150/80 | HR 54 | Ht 64.0 in | Wt 156.4 lb

## 2017-11-27 DIAGNOSIS — I10 Essential (primary) hypertension: Secondary | ICD-10-CM | POA: Diagnosis not present

## 2017-11-27 DIAGNOSIS — I471 Supraventricular tachycardia: Secondary | ICD-10-CM | POA: Diagnosis not present

## 2017-11-27 NOTE — Patient Instructions (Signed)

## 2018-03-03 DIAGNOSIS — F331 Major depressive disorder, recurrent, moderate: Secondary | ICD-10-CM | POA: Diagnosis not present

## 2018-03-03 DIAGNOSIS — Z0000 Encounter for general adult medical examination without abnormal findings: Secondary | ICD-10-CM | POA: Diagnosis not present

## 2018-03-03 DIAGNOSIS — Z1389 Encounter for screening for other disorder: Secondary | ICD-10-CM | POA: Diagnosis not present

## 2018-03-03 DIAGNOSIS — R7303 Prediabetes: Secondary | ICD-10-CM | POA: Diagnosis not present

## 2018-03-03 DIAGNOSIS — I10 Essential (primary) hypertension: Secondary | ICD-10-CM | POA: Diagnosis not present

## 2018-03-03 DIAGNOSIS — S8001XS Contusion of right knee, sequela: Secondary | ICD-10-CM | POA: Diagnosis not present

## 2018-03-30 DIAGNOSIS — G912 (Idiopathic) normal pressure hydrocephalus: Secondary | ICD-10-CM | POA: Diagnosis not present

## 2018-05-04 DIAGNOSIS — R609 Edema, unspecified: Secondary | ICD-10-CM | POA: Diagnosis not present

## 2018-05-04 DIAGNOSIS — I959 Hypotension, unspecified: Secondary | ICD-10-CM | POA: Diagnosis not present

## 2018-05-04 DIAGNOSIS — I700 Atherosclerosis of aorta: Secondary | ICD-10-CM | POA: Diagnosis not present

## 2018-05-04 DIAGNOSIS — R402363 Coma scale, best motor response, obeys commands, at hospital admission: Secondary | ICD-10-CM | POA: Diagnosis present

## 2018-05-04 DIAGNOSIS — M542 Cervicalgia: Secondary | ICD-10-CM | POA: Diagnosis not present

## 2018-05-04 DIAGNOSIS — S0003XA Contusion of scalp, initial encounter: Secondary | ICD-10-CM | POA: Diagnosis not present

## 2018-05-04 DIAGNOSIS — R9431 Abnormal electrocardiogram [ECG] [EKG]: Secondary | ICD-10-CM | POA: Diagnosis not present

## 2018-05-04 DIAGNOSIS — Z043 Encounter for examination and observation following other accident: Secondary | ICD-10-CM | POA: Diagnosis not present

## 2018-05-04 DIAGNOSIS — Z602 Problems related to living alone: Secondary | ICD-10-CM | POA: Diagnosis present

## 2018-05-04 DIAGNOSIS — R001 Bradycardia, unspecified: Secondary | ICD-10-CM | POA: Diagnosis not present

## 2018-05-04 DIAGNOSIS — S065X9A Traumatic subdural hemorrhage with loss of consciousness of unspecified duration, initial encounter: Secondary | ICD-10-CM | POA: Diagnosis not present

## 2018-05-04 DIAGNOSIS — W01198A Fall on same level from slipping, tripping and stumbling with subsequent striking against other object, initial encounter: Secondary | ICD-10-CM | POA: Diagnosis not present

## 2018-05-04 DIAGNOSIS — M6281 Muscle weakness (generalized): Secondary | ICD-10-CM | POA: Diagnosis not present

## 2018-05-04 DIAGNOSIS — R41841 Cognitive communication deficit: Secondary | ICD-10-CM | POA: Diagnosis not present

## 2018-05-04 DIAGNOSIS — R471 Dysarthria and anarthria: Secondary | ICD-10-CM | POA: Diagnosis not present

## 2018-05-04 DIAGNOSIS — M4312 Spondylolisthesis, cervical region: Secondary | ICD-10-CM | POA: Diagnosis not present

## 2018-05-04 DIAGNOSIS — S090XXA Injury of blood vessels of head, not elsewhere classified, initial encounter: Secondary | ICD-10-CM | POA: Diagnosis not present

## 2018-05-04 DIAGNOSIS — I69328 Other speech and language deficits following cerebral infarction: Secondary | ICD-10-CM | POA: Diagnosis not present

## 2018-05-04 DIAGNOSIS — S065X0A Traumatic subdural hemorrhage without loss of consciousness, initial encounter: Secondary | ICD-10-CM | POA: Diagnosis not present

## 2018-05-04 DIAGNOSIS — R0902 Hypoxemia: Secondary | ICD-10-CM | POA: Diagnosis not present

## 2018-05-04 DIAGNOSIS — M545 Low back pain: Secondary | ICD-10-CM | POA: Diagnosis not present

## 2018-05-04 DIAGNOSIS — R4702 Dysphasia: Secondary | ICD-10-CM | POA: Diagnosis not present

## 2018-05-04 DIAGNOSIS — S0101XA Laceration without foreign body of scalp, initial encounter: Secondary | ICD-10-CM | POA: Diagnosis not present

## 2018-05-04 DIAGNOSIS — R51 Headache: Secondary | ICD-10-CM | POA: Diagnosis not present

## 2018-05-04 DIAGNOSIS — Z888 Allergy status to other drugs, medicaments and biological substances status: Secondary | ICD-10-CM | POA: Diagnosis not present

## 2018-05-04 DIAGNOSIS — M47812 Spondylosis without myelopathy or radiculopathy, cervical region: Secondary | ICD-10-CM | POA: Diagnosis not present

## 2018-05-04 DIAGNOSIS — S0993XA Unspecified injury of face, initial encounter: Secondary | ICD-10-CM | POA: Diagnosis not present

## 2018-05-04 DIAGNOSIS — J342 Deviated nasal septum: Secondary | ICD-10-CM | POA: Diagnosis not present

## 2018-05-04 DIAGNOSIS — Z882 Allergy status to sulfonamides status: Secondary | ICD-10-CM | POA: Diagnosis not present

## 2018-05-04 DIAGNOSIS — Z8673 Personal history of transient ischemic attack (TIA), and cerebral infarction without residual deficits: Secondary | ICD-10-CM | POA: Diagnosis not present

## 2018-05-04 DIAGNOSIS — S0181XA Laceration without foreign body of other part of head, initial encounter: Secondary | ICD-10-CM | POA: Diagnosis not present

## 2018-05-04 DIAGNOSIS — R402253 Coma scale, best verbal response, oriented, at hospital admission: Secondary | ICD-10-CM | POA: Diagnosis present

## 2018-05-04 DIAGNOSIS — Z79899 Other long term (current) drug therapy: Secondary | ICD-10-CM | POA: Diagnosis not present

## 2018-05-04 DIAGNOSIS — W19XXXA Unspecified fall, initial encounter: Secondary | ICD-10-CM | POA: Diagnosis not present

## 2018-05-04 DIAGNOSIS — I10 Essential (primary) hypertension: Secondary | ICD-10-CM | POA: Diagnosis not present

## 2018-05-04 DIAGNOSIS — M2669 Other specified disorders of temporomandibular joint: Secondary | ICD-10-CM | POA: Diagnosis not present

## 2018-05-04 DIAGNOSIS — Z743 Need for continuous supervision: Secondary | ICD-10-CM | POA: Diagnosis not present

## 2018-05-04 DIAGNOSIS — R402143 Coma scale, eyes open, spontaneous, at hospital admission: Secondary | ICD-10-CM | POA: Diagnosis present

## 2018-05-04 DIAGNOSIS — D72829 Elevated white blood cell count, unspecified: Secondary | ICD-10-CM | POA: Diagnosis present

## 2018-05-04 DIAGNOSIS — S199XXA Unspecified injury of neck, initial encounter: Secondary | ICD-10-CM | POA: Diagnosis not present

## 2018-05-04 DIAGNOSIS — Z7982 Long term (current) use of aspirin: Secondary | ICD-10-CM | POA: Diagnosis not present

## 2018-05-04 DIAGNOSIS — R0689 Other abnormalities of breathing: Secondary | ICD-10-CM | POA: Diagnosis present

## 2018-05-04 DIAGNOSIS — R220 Localized swelling, mass and lump, head: Secondary | ICD-10-CM | POA: Diagnosis not present

## 2018-05-04 DIAGNOSIS — S065X9D Traumatic subdural hemorrhage with loss of consciousness of unspecified duration, subsequent encounter: Secondary | ICD-10-CM | POA: Diagnosis not present

## 2018-05-04 DIAGNOSIS — Z87891 Personal history of nicotine dependence: Secondary | ICD-10-CM | POA: Diagnosis not present

## 2018-05-04 DIAGNOSIS — I69322 Dysarthria following cerebral infarction: Secondary | ICD-10-CM | POA: Diagnosis not present

## 2018-05-04 DIAGNOSIS — R2689 Other abnormalities of gait and mobility: Secondary | ICD-10-CM | POA: Diagnosis not present

## 2018-05-04 DIAGNOSIS — S0512XA Contusion of eyeball and orbital tissues, left eye, initial encounter: Secondary | ICD-10-CM | POA: Diagnosis not present

## 2018-05-04 DIAGNOSIS — R58 Hemorrhage, not elsewhere classified: Secondary | ICD-10-CM | POA: Diagnosis not present

## 2018-05-04 DIAGNOSIS — F339 Major depressive disorder, recurrent, unspecified: Secondary | ICD-10-CM | POA: Diagnosis not present

## 2018-05-04 DIAGNOSIS — R279 Unspecified lack of coordination: Secondary | ICD-10-CM | POA: Diagnosis not present

## 2018-05-04 DIAGNOSIS — S0012XA Contusion of left eyelid and periocular area, initial encounter: Secondary | ICD-10-CM | POA: Diagnosis not present

## 2018-05-04 DIAGNOSIS — G912 (Idiopathic) normal pressure hydrocephalus: Secondary | ICD-10-CM | POA: Diagnosis present

## 2018-05-04 DIAGNOSIS — I2510 Atherosclerotic heart disease of native coronary artery without angina pectoris: Secondary | ICD-10-CM | POA: Diagnosis not present

## 2018-05-04 DIAGNOSIS — Z982 Presence of cerebrospinal fluid drainage device: Secondary | ICD-10-CM | POA: Diagnosis not present

## 2018-05-04 DIAGNOSIS — Z7902 Long term (current) use of antithrombotics/antiplatelets: Secondary | ICD-10-CM | POA: Diagnosis not present

## 2018-05-04 DIAGNOSIS — Z90710 Acquired absence of both cervix and uterus: Secondary | ICD-10-CM | POA: Diagnosis not present

## 2018-05-04 DIAGNOSIS — E7800 Pure hypercholesterolemia, unspecified: Secondary | ICD-10-CM | POA: Diagnosis not present

## 2018-05-04 DIAGNOSIS — Y998 Other external cause status: Secondary | ICD-10-CM | POA: Diagnosis not present

## 2018-05-04 DIAGNOSIS — E785 Hyperlipidemia, unspecified: Secondary | ICD-10-CM | POA: Diagnosis present

## 2018-05-04 DIAGNOSIS — S0101XD Laceration without foreign body of scalp, subsequent encounter: Secondary | ICD-10-CM | POA: Diagnosis not present

## 2018-05-04 DIAGNOSIS — Z9181 History of falling: Secondary | ICD-10-CM | POA: Diagnosis not present

## 2018-05-07 DIAGNOSIS — R279 Unspecified lack of coordination: Secondary | ICD-10-CM | POA: Diagnosis not present

## 2018-05-07 DIAGNOSIS — S0101XD Laceration without foreign body of scalp, subsequent encounter: Secondary | ICD-10-CM | POA: Diagnosis not present

## 2018-05-07 DIAGNOSIS — R2689 Other abnormalities of gait and mobility: Secondary | ICD-10-CM | POA: Diagnosis not present

## 2018-05-07 DIAGNOSIS — Z7902 Long term (current) use of antithrombotics/antiplatelets: Secondary | ICD-10-CM | POA: Diagnosis not present

## 2018-05-07 DIAGNOSIS — R41841 Cognitive communication deficit: Secondary | ICD-10-CM | POA: Diagnosis not present

## 2018-05-07 DIAGNOSIS — M7981 Nontraumatic hematoma of soft tissue: Secondary | ICD-10-CM | POA: Diagnosis not present

## 2018-05-07 DIAGNOSIS — S0512XA Contusion of eyeball and orbital tissues, left eye, initial encounter: Secondary | ICD-10-CM | POA: Diagnosis not present

## 2018-05-07 DIAGNOSIS — J811 Chronic pulmonary edema: Secondary | ICD-10-CM | POA: Diagnosis not present

## 2018-05-07 DIAGNOSIS — I959 Hypotension, unspecified: Secondary | ICD-10-CM | POA: Diagnosis not present

## 2018-05-07 DIAGNOSIS — R0902 Hypoxemia: Secondary | ICD-10-CM | POA: Diagnosis not present

## 2018-05-07 DIAGNOSIS — S0101XA Laceration without foreign body of scalp, initial encounter: Secondary | ICD-10-CM | POA: Diagnosis not present

## 2018-05-07 DIAGNOSIS — S065X0A Traumatic subdural hemorrhage without loss of consciousness, initial encounter: Secondary | ICD-10-CM | POA: Diagnosis not present

## 2018-05-07 DIAGNOSIS — S065X9D Traumatic subdural hemorrhage with loss of consciousness of unspecified duration, subsequent encounter: Secondary | ICD-10-CM | POA: Diagnosis not present

## 2018-05-07 DIAGNOSIS — G912 (Idiopathic) normal pressure hydrocephalus: Secondary | ICD-10-CM | POA: Diagnosis not present

## 2018-05-07 DIAGNOSIS — E119 Type 2 diabetes mellitus without complications: Secondary | ICD-10-CM | POA: Diagnosis not present

## 2018-05-07 DIAGNOSIS — Z743 Need for continuous supervision: Secondary | ICD-10-CM | POA: Diagnosis not present

## 2018-05-07 DIAGNOSIS — W19XXXA Unspecified fall, initial encounter: Secondary | ICD-10-CM | POA: Diagnosis not present

## 2018-05-07 DIAGNOSIS — M6281 Muscle weakness (generalized): Secondary | ICD-10-CM | POA: Diagnosis not present

## 2018-05-07 DIAGNOSIS — E7849 Other hyperlipidemia: Secondary | ICD-10-CM | POA: Diagnosis not present

## 2018-05-07 DIAGNOSIS — I10 Essential (primary) hypertension: Secondary | ICD-10-CM | POA: Diagnosis not present

## 2018-05-07 DIAGNOSIS — Z7982 Long term (current) use of aspirin: Secondary | ICD-10-CM | POA: Diagnosis not present

## 2018-05-07 DIAGNOSIS — Z9181 History of falling: Secondary | ICD-10-CM | POA: Diagnosis not present

## 2018-05-07 DIAGNOSIS — F339 Major depressive disorder, recurrent, unspecified: Secondary | ICD-10-CM | POA: Diagnosis not present

## 2018-05-07 DIAGNOSIS — R4702 Dysphasia: Secondary | ICD-10-CM | POA: Diagnosis not present

## 2018-05-08 DIAGNOSIS — M7981 Nontraumatic hematoma of soft tissue: Secondary | ICD-10-CM | POA: Diagnosis not present

## 2018-05-08 DIAGNOSIS — E119 Type 2 diabetes mellitus without complications: Secondary | ICD-10-CM | POA: Diagnosis not present

## 2018-05-08 DIAGNOSIS — E7849 Other hyperlipidemia: Secondary | ICD-10-CM | POA: Diagnosis not present

## 2018-05-31 DIAGNOSIS — I10 Essential (primary) hypertension: Secondary | ICD-10-CM | POA: Diagnosis not present

## 2018-05-31 DIAGNOSIS — G912 (Idiopathic) normal pressure hydrocephalus: Secondary | ICD-10-CM | POA: Diagnosis not present

## 2018-06-03 ENCOUNTER — Other Ambulatory Visit: Payer: Self-pay | Admitting: *Deleted

## 2018-06-03 NOTE — Patient Outreach (Signed)
[facility]) Care Management  06/03/2018  Brandi [name] 15-Jan-1941 [id number]   Onsite visit to [facility]. Met with patient..  Patient reports she is going home on Saturday, she lives alone but has support from her children and her sister.  Patient states that she has been to the facility a couple times in the past and has had great experiences.  She states that she will be doing most of her ADLs and IADLs herself with extra assistance from family. She does not have any hired help at this time.  Patient does have speech impairment due to head trauma from a fall.   RNCM reviewed [facility] care management Patient gave verbal consent for a referral for Transition of care.  She states she can talk on the phone, but that it takes time due to her new impairment with the head trauma.  She was able to give me her cell as [phone] Her son is Brandi [name] and his cell is: [phone] Her sister is Brandi [name] and her number is [phone].  Plan to refer to [facility] care management for [facility] community to provide transition of care. Patient will discharge 06/05/18 [facility]   [place]. Laymond Purser, RN, BSN, [place] [phone]) Business Cell  [phone]) Toll Free Office

## 2018-06-07 ENCOUNTER — Other Ambulatory Visit: Payer: Self-pay | Admitting: *Deleted

## 2018-06-07 DIAGNOSIS — I10 Essential (primary) hypertension: Secondary | ICD-10-CM | POA: Diagnosis not present

## 2018-06-07 DIAGNOSIS — R471 Dysarthria and anarthria: Secondary | ICD-10-CM | POA: Diagnosis not present

## 2018-06-07 DIAGNOSIS — E119 Type 2 diabetes mellitus without complications: Secondary | ICD-10-CM | POA: Diagnosis not present

## 2018-06-07 DIAGNOSIS — Z7901 Long term (current) use of anticoagulants: Secondary | ICD-10-CM | POA: Diagnosis not present

## 2018-06-07 DIAGNOSIS — Z982 Presence of cerebrospinal fluid drainage device: Secondary | ICD-10-CM | POA: Diagnosis not present

## 2018-06-07 DIAGNOSIS — S065X9D Traumatic subdural hemorrhage with loss of consciousness of unspecified duration, subsequent encounter: Secondary | ICD-10-CM | POA: Diagnosis not present

## 2018-06-07 DIAGNOSIS — Z8673 Personal history of transient ischemic attack (TIA), and cerebral infarction without residual deficits: Secondary | ICD-10-CM | POA: Diagnosis not present

## 2018-06-07 DIAGNOSIS — Z9181 History of falling: Secondary | ICD-10-CM | POA: Diagnosis not present

## 2018-06-07 DIAGNOSIS — M6281 Muscle weakness (generalized): Secondary | ICD-10-CM | POA: Diagnosis not present

## 2018-06-07 NOTE — Patient Outreach (Addendum)
Outreach call to pt for transition of care, pt discharged [facility] 06/05/18, diagnosis hydrocephalus, HTN, speech impairment related to fall and head trauma, patient's sister and son listed as contacts if needed, RN CM called pt at [phone] with no answer and left voicemail requesting return phone call, called [phone] with no answer and no option to leave voicemail, called patient's sister Brandi [name] at [phone] and Brandi [name] states she saw pt this morning and not sure why she is not answering the phone, Brandi [name] states she will go back to pt home to check on her and request permission for RN CM to complete home visit this week. RN CM completed transition of care template with Brandi [name], will complete care planning after talking with pt.  PLAN Will await call back from [facility] regarding home visit for this week  Addendum: By end of day patient's sister had not called back,  RN CM called her back and Brandi [name] states pt wants to wait and talk with her son before making a decision about the [facility] program or whether she wants a home visit, pt will call RN CM.  PLAN Outreach pt in 3-4 business days.  Jacqlyn Larsen [facility], [place] Coordinator [phone]

## 2018-06-09 DIAGNOSIS — R55 Syncope and collapse: Secondary | ICD-10-CM | POA: Diagnosis not present

## 2018-06-09 DIAGNOSIS — I10 Essential (primary) hypertension: Secondary | ICD-10-CM | POA: Diagnosis not present

## 2018-06-09 DIAGNOSIS — F331 Major depressive disorder, recurrent, moderate: Secondary | ICD-10-CM | POA: Diagnosis not present

## 2018-06-10 ENCOUNTER — Other Ambulatory Visit: Payer: Self-pay | Admitting: *Deleted

## 2018-06-10 DIAGNOSIS — S065X9D Traumatic subdural hemorrhage with loss of consciousness of unspecified duration, subsequent encounter: Secondary | ICD-10-CM | POA: Diagnosis not present

## 2018-06-10 DIAGNOSIS — M6281 Muscle weakness (generalized): Secondary | ICD-10-CM | POA: Diagnosis not present

## 2018-06-10 DIAGNOSIS — Z8673 Personal history of transient ischemic attack (TIA), and cerebral infarction without residual deficits: Secondary | ICD-10-CM | POA: Diagnosis not present

## 2018-06-10 DIAGNOSIS — E119 Type 2 diabetes mellitus without complications: Secondary | ICD-10-CM | POA: Diagnosis not present

## 2018-06-10 DIAGNOSIS — R471 Dysarthria and anarthria: Secondary | ICD-10-CM | POA: Diagnosis not present

## 2018-06-10 DIAGNOSIS — I10 Essential (primary) hypertension: Secondary | ICD-10-CM | POA: Diagnosis not present

## 2018-06-10 NOTE — Patient Outreach (Signed)
Telephone outreach to pt to schedule initial home visit, spoke with pt, HIPAA verified, some of what pt says is difficult to understand due to speech impairment, pt agreeable to home visit for face to face. Initial home visit scheduled for next week.  PLAN See pt for initial home visit next week  Jacqlyn Larsen [facility], [place] Coordinator [phone]

## 2018-06-11 DIAGNOSIS — I10 Essential (primary) hypertension: Secondary | ICD-10-CM | POA: Diagnosis not present

## 2018-06-11 DIAGNOSIS — R471 Dysarthria and anarthria: Secondary | ICD-10-CM | POA: Diagnosis not present

## 2018-06-11 DIAGNOSIS — E119 Type 2 diabetes mellitus without complications: Secondary | ICD-10-CM | POA: Diagnosis not present

## 2018-06-11 DIAGNOSIS — S065X9D Traumatic subdural hemorrhage with loss of consciousness of unspecified duration, subsequent encounter: Secondary | ICD-10-CM | POA: Diagnosis not present

## 2018-06-11 DIAGNOSIS — M6281 Muscle weakness (generalized): Secondary | ICD-10-CM | POA: Diagnosis not present

## 2018-06-11 DIAGNOSIS — Z8673 Personal history of transient ischemic attack (TIA), and cerebral infarction without residual deficits: Secondary | ICD-10-CM | POA: Diagnosis not present

## 2018-06-15 ENCOUNTER — Other Ambulatory Visit: Payer: Self-pay | Admitting: *Deleted

## 2018-06-15 ENCOUNTER — Encounter: Payer: Self-pay | Admitting: *Deleted

## 2018-06-15 DIAGNOSIS — M6281 Muscle weakness (generalized): Secondary | ICD-10-CM | POA: Diagnosis not present

## 2018-06-15 DIAGNOSIS — S065X9D Traumatic subdural hemorrhage with loss of consciousness of unspecified duration, subsequent encounter: Secondary | ICD-10-CM | POA: Diagnosis not present

## 2018-06-15 DIAGNOSIS — R471 Dysarthria and anarthria: Secondary | ICD-10-CM | POA: Diagnosis not present

## 2018-06-15 DIAGNOSIS — Z8673 Personal history of transient ischemic attack (TIA), and cerebral infarction without residual deficits: Secondary | ICD-10-CM | POA: Diagnosis not present

## 2018-06-15 DIAGNOSIS — I10 Essential (primary) hypertension: Secondary | ICD-10-CM | POA: Diagnosis not present

## 2018-06-15 DIAGNOSIS — E119 Type 2 diabetes mellitus without complications: Secondary | ICD-10-CM | POA: Diagnosis not present

## 2018-06-15 NOTE — Patient Outreach (Signed)
[facility]) Care Management   06/15/2018  Brandi [name] 02-13-1941 [id number]  Brandi [name] is an 78 y.o. female  Subjective: Initial home visit with pt, HIPAA verified, pt reports her main goal is to not have any falls and " be back like I was before falling"  Pt states she had a stroke approximately 4 years ago which affected her speech and her right side and pt not able to write, (can sign X for her name), has had several falls with head trauma and hydrocephalus with shunt related to previous fall.  Pt reports she has grown son that lives nearby and assists her as needed, has sister next door that comes at stays with her at night temporarily.  Pt states she has PT, OT, ST, RN and is using her walker.  Pt saw primary MD on 06/08/18 and no changes made.  Objective:   Vitals:   06/15/18 1207  BP: 116/64  Pulse: (!) 57  Resp: 16  SpO2: 98%  Weight: 141 lb (64 kg)  Height: 1.6 m (5\' 3" )   ROS  Physical Exam  Constitutional: She is oriented to person, place, and time. She appears well-developed and well-nourished.  HENT:  Head: Normocephalic.  Neck: Normal range of motion. Neck supple.  Cardiovascular: Regular rhythm.  Respiratory: Effort normal and breath sounds normal.  GI: Soft. Bowel sounds are normal.  Musculoskeletal: Normal range of motion.        General: No edema.  Neurological: She is alert and oriented to person, place, and time.  Speech impairment  Skin: Skin is warm and dry.  Psychiatric: She has a normal mood and affect. Her behavior is normal. Judgment and thought content normal.    Encounter Medications:   Outpatient Encounter Medications as of 06/15/2018  Medication Sig Note  . acetaminophen (TYLENOL) 500 MG tablet Take 500 mg by mouth daily as needed for moderate pain or headache.   Marland Kitchen aspirin 81 MG EC tablet Take 81 mg by mouth daily.     . Cholecalciferol (VITAMIN D) 2000 UNITS CAPS Take 2,000 Units by mouth daily.    . clopidogrel  (PLAVIX) 75 MG tablet Take 75 mg by mouth daily. 03/17/2017: On hold for procedure  . fish oil-omega-3 fatty acids 1000 MG capsule Take 1 g by mouth daily.    . hydrochlorothiazide (MICROZIDE) 12.5 MG capsule Take 1 capsule (12.5 mg total) by mouth daily.   . meclizine (ANTIVERT) 12.5 MG tablet Take 1 tablet (12.5 mg total) by mouth 3 (three) times daily as needed. (Patient taking differently: Take 12.5 mg by mouth 3 (three) times daily as needed for dizziness. )   . metoprolol succinate (TOPROL-XL) 25 MG 24 hr tablet Take 25 mg by mouth daily.   . Multiple Vitamin (MULTIVITAMIN) tablet Take 1 tablet by mouth daily.   . pravastatin (PRAVACHOL) 20 MG tablet Take 1 tablet (20 mg total) by mouth daily.   . sertraline (ZOLOFT) 50 MG tablet Take 50 mg by mouth daily.   Marland Kitchen zolpidem (AMBIEN) 5 MG tablet Take 5 mg by mouth at bedtime as needed.   Marland Kitchen acetaminophen-codeine (TYLENOL #3) 300-30 MG tablet Take 1 tablet by mouth every 6 (six) hours as needed for moderate pain. (Patient not taking: Reported on 06/15/2018)   . dicyclomine (BENTYL) 10 MG capsule Take 10 mg by mouth 2 (two) times daily.     No facility-administered encounter medications on file as of 06/15/2018.     Functional Status:  In your present state of health, do you have any difficulty performing the following activities: 06/15/2018  Hearing? N  Vision? N  Difficulty concentrating or making decisions? N  Walking or climbing stairs? Y  Dressing or bathing? Y  Doing errands, shopping? Y  Preparing Food and eating ? Y  Using the Toilet? N  In the past six months, have you accidently leaked urine? N  Do you have problems with loss of bowel control? N  Managing your Medications? N  Managing your Finances? Y  Housekeeping or managing your Housekeeping? Y  Some recent data might be hidden    Fall/Depression Screening:    Fall Risk  06/15/2018 05/30/2015 11/14/2014  Falls in the past year? 1 No No  Number falls in past yr: 0 - -  Injury  with Fall? 1 - -  Risk for fall due to : History of fall(s);Medication side effect;Impaired balance/gait - -  Follow up Falls evaluation completed;Falls prevention discussed;Education provided - -   PHQ 2/9 Scores 06/15/2018 05/30/2015 11/14/2014 09/08/2013  PHQ - 2 Score 0 0 0 0    Assessment:  RN CM observed and reviewed medication bottles with pt., spoke with patient's sister Brandi [name] (permission given by pt) and reviewed plan of care, safety precautions and pt continued need for assistance in the home.  Pt has all needed DME. RN CM faxed barrier letter and initial home visit to primary MD Hasanaj.    THN CM Care Plan Problem One     Most Recent Value  Care Plan Problem One  Pt high risk for falls  Role Documenting the Problem One  Care Management [place] for Problem One  Active  THN Long Term Goal   Pt will increase level of functioning with ADL's, IADL's , ambulating within 60 days  THN Long Term Goal Start Date  06/15/18  Interventions for Problem One Long Term Goal  RN CM reviewed plan of care with pt who reports she would " like to get back like I was and be able to do more here at home"  [facility] CM Short Term Goal #1   Pt will verbalize and adhere to safety precautions within 30 days  THN CM Short Term Goal #1 Start Date  06/15/18  Interventions for Short Term Goal #1  RN CM reviewed safety precautions, importance of using walker at all times, asking for assistance as needed  THN CM Short Term Goal #2   Pt will work with home health PT and compelte prescribed exercises daily within 30 days  THN CM Short Term Goal #2 Start Date  06/15/18  Interventions for Short Term Goal #2  RN CM ask pt to complete prescribed exercises daily and ask for assistance or someone to be present if needed, reviewed importance of strength training, endurance on overall health      Plan: continue weekly transition of care  [name], [place]  Coordinator [phone]

## 2018-06-17 DIAGNOSIS — S065X9D Traumatic subdural hemorrhage with loss of consciousness of unspecified duration, subsequent encounter: Secondary | ICD-10-CM | POA: Diagnosis not present

## 2018-06-17 DIAGNOSIS — E119 Type 2 diabetes mellitus without complications: Secondary | ICD-10-CM | POA: Diagnosis not present

## 2018-06-17 DIAGNOSIS — Z8673 Personal history of transient ischemic attack (TIA), and cerebral infarction without residual deficits: Secondary | ICD-10-CM | POA: Diagnosis not present

## 2018-06-17 DIAGNOSIS — R471 Dysarthria and anarthria: Secondary | ICD-10-CM | POA: Diagnosis not present

## 2018-06-17 DIAGNOSIS — I10 Essential (primary) hypertension: Secondary | ICD-10-CM | POA: Diagnosis not present

## 2018-06-17 DIAGNOSIS — M6281 Muscle weakness (generalized): Secondary | ICD-10-CM | POA: Diagnosis not present

## 2018-06-21 ENCOUNTER — Other Ambulatory Visit: Payer: Self-pay | Admitting: *Deleted

## 2018-06-21 NOTE — Patient Outreach (Signed)
Telephone outreach for transition of care week 3, no answer to telephone, left voicemail requesting return phone call.  RN CM mailed unsuccessful outreach letter to pt home.  PLAN Outreach pt in 3-4 business days  Jacqlyn Larsen [facility], [place] [phone]

## 2018-06-24 ENCOUNTER — Other Ambulatory Visit: Payer: Self-pay | Admitting: *Deleted

## 2018-06-24 NOTE — Patient Outreach (Signed)
Outreach telephone call to pt (2nd attempt) for transition of care week 3, no answer to home telephone or cell phone, left voicemail requesting return phone call.  PLAN Outreach pt in 3-4 business days  Jacqlyn Larsen [facility], [place] [phone]

## 2018-06-25 DIAGNOSIS — S065X9D Traumatic subdural hemorrhage with loss of consciousness of unspecified duration, subsequent encounter: Secondary | ICD-10-CM | POA: Diagnosis not present

## 2018-06-25 DIAGNOSIS — R471 Dysarthria and anarthria: Secondary | ICD-10-CM | POA: Diagnosis not present

## 2018-06-25 DIAGNOSIS — E119 Type 2 diabetes mellitus without complications: Secondary | ICD-10-CM | POA: Diagnosis not present

## 2018-06-25 DIAGNOSIS — Z8673 Personal history of transient ischemic attack (TIA), and cerebral infarction without residual deficits: Secondary | ICD-10-CM | POA: Diagnosis not present

## 2018-06-25 DIAGNOSIS — M6281 Muscle weakness (generalized): Secondary | ICD-10-CM | POA: Diagnosis not present

## 2018-06-25 DIAGNOSIS — I10 Essential (primary) hypertension: Secondary | ICD-10-CM | POA: Diagnosis not present

## 2018-06-29 ENCOUNTER — Other Ambulatory Visit: Payer: Self-pay | Admitting: *Deleted

## 2018-06-29 DIAGNOSIS — M6281 Muscle weakness (generalized): Secondary | ICD-10-CM | POA: Diagnosis not present

## 2018-06-29 DIAGNOSIS — I10 Essential (primary) hypertension: Secondary | ICD-10-CM | POA: Diagnosis not present

## 2018-06-29 DIAGNOSIS — R471 Dysarthria and anarthria: Secondary | ICD-10-CM | POA: Diagnosis not present

## 2018-06-29 DIAGNOSIS — E119 Type 2 diabetes mellitus without complications: Secondary | ICD-10-CM | POA: Diagnosis not present

## 2018-06-29 DIAGNOSIS — Z8673 Personal history of transient ischemic attack (TIA), and cerebral infarction without residual deficits: Secondary | ICD-10-CM | POA: Diagnosis not present

## 2018-06-29 DIAGNOSIS — S065X9D Traumatic subdural hemorrhage with loss of consciousness of unspecified duration, subsequent encounter: Secondary | ICD-10-CM | POA: Diagnosis not present

## 2018-06-29 NOTE — Patient Outreach (Signed)
Outreach call to pt for transition of care week 4, spoke with pt, HIPAA verified, pt reports " I'm doing fine"  Reports taking all medications as prescribed with no changes to report, pt states she has had no falls, continues using walker. Pt states she is "washing my hands a lot since it's flu season".  No new concerns voiced.  THN CM Care Plan Problem One     Most Recent Value  Care Plan Problem One  Pt high risk for falls  Role Documenting the Problem One  Care Management [place] for Problem One  Active  THN Long Term Goal   Pt will increase level of functioning with ADL's, IADL's , ambulating within 60 days  THN Long Term Goal Start Date  06/15/18  Interventions for Problem One Long Term Goal  RN CM reinforced plan of care with goal of being more independent at home.  THN CM Short Term Goal #1   Pt will verbalize and adhere to safety precautions within 30 days  THN CM Short Term Goal #1 Start Date  06/15/18  Interventions for Short Term Goal #1  RN CM reinforced safety precautions, importance of using DME, asking for assistance as needed  THN CM Short Term Goal #2   Pt will work with home health PT and compelte prescribed exercises daily within 30 days  THN CM Short Term Goal #2 Start Date  06/15/18  Interventions for Short Term Goal #2  RN CM reinforced importance of completing exercises daily, pt states she is trying to complete exercises daily, ongoing goal      PLAN Outreach pt next month for telephonic assessment  Jacqlyn Larsen [facility], [place] Coordinator [phone]

## 2018-07-07 DIAGNOSIS — D692 Other nonthrombocytopenic purpura: Secondary | ICD-10-CM | POA: Diagnosis not present

## 2018-07-07 DIAGNOSIS — B079 Viral wart, unspecified: Secondary | ICD-10-CM | POA: Diagnosis not present

## 2018-07-27 ENCOUNTER — Encounter: Payer: Self-pay | Admitting: *Deleted

## 2018-07-27 ENCOUNTER — Other Ambulatory Visit: Payer: Self-pay | Admitting: *Deleted

## 2018-07-27 NOTE — Patient Outreach (Signed)
Outreach call to pt for telephonic assessment, spoke with pt, HIPAA verified, pt reports " I'm doing fine"  Attending all MD appointments, continues to have assistance of family members,  Reports taking all medications as prescribed, denies falls,  Pt reports she has no further needs for care management.  RN CM faxed case closure letter to primary MD Dr. Sherrie Sport, mailed case closure letter to pt home.  THN CM Care Plan Problem One     Most Recent Value  Care Plan Problem One  Pt high risk for falls  Role Documenting the Problem One  Care Management [place] for Problem One  Active  THN Long Term Goal   Pt will increase level of functioning with ADL's, IADL's , ambulating within 60 days  THN Long Term Goal Start Date  06/15/18  [facility] Long Term Goal Met Date  07/27/18  Interventions for Problem One Long Term Goal  RN CM reviewed plan of care with pt, reviewed discharge plan and will discharge from community today  THN CM Short Term Goal #1   Pt will verbalize and adhere to safety precautions within 30 days  THN CM Short Term Goal #1 Start Date  06/15/18  [facility] CM Short Term Goal #1 Met Date  07/27/18  Interventions for Short Term Goal #1  Pt denies any falls,  RN CM reviewed safety precautions  THN CM Short Term Goal #2   Pt will work with home health PT and compelte prescribed exercises daily within 30 days  THN CM Short Term Goal #2 Start Date  06/15/18  [facility] CM Short Term Goal #2 Met Date  07/27/18  Interventions for Short Term Goal #2  Pt reports she is completing exercises and worked with PT      PLAN Case closure today  Jacqlyn Larsen [facility], [place] Coordinator [phone]

## 2018-10-07 DIAGNOSIS — I10 Essential (primary) hypertension: Secondary | ICD-10-CM | POA: Diagnosis not present

## 2018-10-07 DIAGNOSIS — F331 Major depressive disorder, recurrent, moderate: Secondary | ICD-10-CM | POA: Diagnosis not present

## 2019-01-04 DIAGNOSIS — G912 (Idiopathic) normal pressure hydrocephalus: Secondary | ICD-10-CM | POA: Diagnosis not present

## 2019-01-04 DIAGNOSIS — I10 Essential (primary) hypertension: Secondary | ICD-10-CM | POA: Diagnosis not present

## 2019-01-06 DIAGNOSIS — I6200 Nontraumatic subdural hemorrhage, unspecified: Secondary | ICD-10-CM | POA: Diagnosis not present

## 2019-01-06 DIAGNOSIS — I10 Essential (primary) hypertension: Secondary | ICD-10-CM | POA: Diagnosis not present

## 2019-01-06 DIAGNOSIS — I63442 Cerebral infarction due to embolism of left cerebellar artery: Secondary | ICD-10-CM | POA: Diagnosis not present

## 2019-01-06 DIAGNOSIS — R630 Anorexia: Secondary | ICD-10-CM | POA: Diagnosis not present

## 2019-01-06 DIAGNOSIS — G912 (Idiopathic) normal pressure hydrocephalus: Secondary | ICD-10-CM | POA: Diagnosis not present

## 2019-01-06 DIAGNOSIS — F331 Major depressive disorder, recurrent, moderate: Secondary | ICD-10-CM | POA: Diagnosis not present

## 2019-01-10 DIAGNOSIS — I6203 Nontraumatic chronic subdural hemorrhage: Secondary | ICD-10-CM | POA: Diagnosis not present

## 2019-01-10 DIAGNOSIS — G912 (Idiopathic) normal pressure hydrocephalus: Secondary | ICD-10-CM | POA: Diagnosis not present

## 2019-01-10 DIAGNOSIS — I10 Essential (primary) hypertension: Secondary | ICD-10-CM | POA: Diagnosis not present

## 2019-02-07 DIAGNOSIS — I6203 Nontraumatic chronic subdural hemorrhage: Secondary | ICD-10-CM | POA: Diagnosis not present

## 2019-02-10 DIAGNOSIS — I6203 Nontraumatic chronic subdural hemorrhage: Secondary | ICD-10-CM | POA: Diagnosis not present

## 2019-02-10 DIAGNOSIS — G912 (Idiopathic) normal pressure hydrocephalus: Secondary | ICD-10-CM | POA: Diagnosis not present

## 2019-02-14 DIAGNOSIS — Z9181 History of falling: Secondary | ICD-10-CM | POA: Diagnosis not present

## 2019-02-14 DIAGNOSIS — R262 Difficulty in walking, not elsewhere classified: Secondary | ICD-10-CM | POA: Diagnosis not present

## 2019-02-14 DIAGNOSIS — I6203 Nontraumatic chronic subdural hemorrhage: Secondary | ICD-10-CM | POA: Diagnosis not present

## 2019-02-14 DIAGNOSIS — I69298 Other sequelae of other nontraumatic intracranial hemorrhage: Secondary | ICD-10-CM | POA: Diagnosis not present

## 2019-02-14 DIAGNOSIS — Z7982 Long term (current) use of aspirin: Secondary | ICD-10-CM | POA: Diagnosis not present

## 2019-02-14 DIAGNOSIS — R630 Anorexia: Secondary | ICD-10-CM | POA: Diagnosis not present

## 2019-02-14 DIAGNOSIS — Z7902 Long term (current) use of antithrombotics/antiplatelets: Secondary | ICD-10-CM | POA: Diagnosis not present

## 2019-02-14 DIAGNOSIS — F331 Major depressive disorder, recurrent, moderate: Secondary | ICD-10-CM | POA: Diagnosis not present

## 2019-02-14 DIAGNOSIS — Z87891 Personal history of nicotine dependence: Secondary | ICD-10-CM | POA: Diagnosis not present

## 2019-02-14 DIAGNOSIS — Z982 Presence of cerebrospinal fluid drainage device: Secondary | ICD-10-CM | POA: Diagnosis not present

## 2019-02-14 DIAGNOSIS — I10 Essential (primary) hypertension: Secondary | ICD-10-CM | POA: Diagnosis not present

## 2019-02-14 DIAGNOSIS — I69220 Aphasia following other nontraumatic intracranial hemorrhage: Secondary | ICD-10-CM | POA: Diagnosis not present

## 2019-02-18 DIAGNOSIS — R262 Difficulty in walking, not elsewhere classified: Secondary | ICD-10-CM | POA: Diagnosis not present

## 2019-02-18 DIAGNOSIS — I69298 Other sequelae of other nontraumatic intracranial hemorrhage: Secondary | ICD-10-CM | POA: Diagnosis not present

## 2019-02-18 DIAGNOSIS — I10 Essential (primary) hypertension: Secondary | ICD-10-CM | POA: Diagnosis not present

## 2019-02-18 DIAGNOSIS — F331 Major depressive disorder, recurrent, moderate: Secondary | ICD-10-CM | POA: Diagnosis not present

## 2019-02-18 DIAGNOSIS — R630 Anorexia: Secondary | ICD-10-CM | POA: Diagnosis not present

## 2019-02-18 DIAGNOSIS — I69220 Aphasia following other nontraumatic intracranial hemorrhage: Secondary | ICD-10-CM | POA: Diagnosis not present

## 2019-02-21 DIAGNOSIS — R262 Difficulty in walking, not elsewhere classified: Secondary | ICD-10-CM | POA: Diagnosis not present

## 2019-02-21 DIAGNOSIS — I69220 Aphasia following other nontraumatic intracranial hemorrhage: Secondary | ICD-10-CM | POA: Diagnosis not present

## 2019-02-21 DIAGNOSIS — R630 Anorexia: Secondary | ICD-10-CM | POA: Diagnosis not present

## 2019-02-21 DIAGNOSIS — I69298 Other sequelae of other nontraumatic intracranial hemorrhage: Secondary | ICD-10-CM | POA: Diagnosis not present

## 2019-02-21 DIAGNOSIS — F331 Major depressive disorder, recurrent, moderate: Secondary | ICD-10-CM | POA: Diagnosis not present

## 2019-02-21 DIAGNOSIS — I10 Essential (primary) hypertension: Secondary | ICD-10-CM | POA: Diagnosis not present

## 2019-02-23 DIAGNOSIS — I69220 Aphasia following other nontraumatic intracranial hemorrhage: Secondary | ICD-10-CM | POA: Diagnosis not present

## 2019-02-23 DIAGNOSIS — F331 Major depressive disorder, recurrent, moderate: Secondary | ICD-10-CM | POA: Diagnosis not present

## 2019-02-23 DIAGNOSIS — R630 Anorexia: Secondary | ICD-10-CM | POA: Diagnosis not present

## 2019-02-23 DIAGNOSIS — R262 Difficulty in walking, not elsewhere classified: Secondary | ICD-10-CM | POA: Diagnosis not present

## 2019-02-23 DIAGNOSIS — I69298 Other sequelae of other nontraumatic intracranial hemorrhage: Secondary | ICD-10-CM | POA: Diagnosis not present

## 2019-02-23 DIAGNOSIS — I10 Essential (primary) hypertension: Secondary | ICD-10-CM | POA: Diagnosis not present

## 2019-02-28 DIAGNOSIS — I10 Essential (primary) hypertension: Secondary | ICD-10-CM | POA: Diagnosis not present

## 2019-02-28 DIAGNOSIS — R262 Difficulty in walking, not elsewhere classified: Secondary | ICD-10-CM | POA: Diagnosis not present

## 2019-02-28 DIAGNOSIS — I69298 Other sequelae of other nontraumatic intracranial hemorrhage: Secondary | ICD-10-CM | POA: Diagnosis not present

## 2019-02-28 DIAGNOSIS — F331 Major depressive disorder, recurrent, moderate: Secondary | ICD-10-CM | POA: Diagnosis not present

## 2019-02-28 DIAGNOSIS — I69220 Aphasia following other nontraumatic intracranial hemorrhage: Secondary | ICD-10-CM | POA: Diagnosis not present

## 2019-02-28 DIAGNOSIS — R630 Anorexia: Secondary | ICD-10-CM | POA: Diagnosis not present

## 2019-03-03 DIAGNOSIS — R630 Anorexia: Secondary | ICD-10-CM | POA: Diagnosis not present

## 2019-03-03 DIAGNOSIS — I69220 Aphasia following other nontraumatic intracranial hemorrhage: Secondary | ICD-10-CM | POA: Diagnosis not present

## 2019-03-03 DIAGNOSIS — F331 Major depressive disorder, recurrent, moderate: Secondary | ICD-10-CM | POA: Diagnosis not present

## 2019-03-03 DIAGNOSIS — I10 Essential (primary) hypertension: Secondary | ICD-10-CM | POA: Diagnosis not present

## 2019-03-03 DIAGNOSIS — R262 Difficulty in walking, not elsewhere classified: Secondary | ICD-10-CM | POA: Diagnosis not present

## 2019-03-03 DIAGNOSIS — I69298 Other sequelae of other nontraumatic intracranial hemorrhage: Secondary | ICD-10-CM | POA: Diagnosis not present

## 2019-03-04 DIAGNOSIS — R630 Anorexia: Secondary | ICD-10-CM | POA: Diagnosis not present

## 2019-03-04 DIAGNOSIS — I10 Essential (primary) hypertension: Secondary | ICD-10-CM | POA: Diagnosis not present

## 2019-03-04 DIAGNOSIS — F331 Major depressive disorder, recurrent, moderate: Secondary | ICD-10-CM | POA: Diagnosis not present

## 2019-03-04 DIAGNOSIS — I69220 Aphasia following other nontraumatic intracranial hemorrhage: Secondary | ICD-10-CM | POA: Diagnosis not present

## 2019-03-04 DIAGNOSIS — I69298 Other sequelae of other nontraumatic intracranial hemorrhage: Secondary | ICD-10-CM | POA: Diagnosis not present

## 2019-03-04 DIAGNOSIS — R262 Difficulty in walking, not elsewhere classified: Secondary | ICD-10-CM | POA: Diagnosis not present

## 2019-03-07 DIAGNOSIS — R262 Difficulty in walking, not elsewhere classified: Secondary | ICD-10-CM | POA: Diagnosis not present

## 2019-03-07 DIAGNOSIS — R630 Anorexia: Secondary | ICD-10-CM | POA: Diagnosis not present

## 2019-03-07 DIAGNOSIS — I10 Essential (primary) hypertension: Secondary | ICD-10-CM | POA: Diagnosis not present

## 2019-03-07 DIAGNOSIS — I69220 Aphasia following other nontraumatic intracranial hemorrhage: Secondary | ICD-10-CM | POA: Diagnosis not present

## 2019-03-07 DIAGNOSIS — F331 Major depressive disorder, recurrent, moderate: Secondary | ICD-10-CM | POA: Diagnosis not present

## 2019-03-07 DIAGNOSIS — I69298 Other sequelae of other nontraumatic intracranial hemorrhage: Secondary | ICD-10-CM | POA: Diagnosis not present

## 2019-03-08 DIAGNOSIS — R630 Anorexia: Secondary | ICD-10-CM | POA: Diagnosis not present

## 2019-03-08 DIAGNOSIS — F331 Major depressive disorder, recurrent, moderate: Secondary | ICD-10-CM | POA: Diagnosis not present

## 2019-03-08 DIAGNOSIS — I69220 Aphasia following other nontraumatic intracranial hemorrhage: Secondary | ICD-10-CM | POA: Diagnosis not present

## 2019-03-08 DIAGNOSIS — I10 Essential (primary) hypertension: Secondary | ICD-10-CM | POA: Diagnosis not present

## 2019-03-08 DIAGNOSIS — R262 Difficulty in walking, not elsewhere classified: Secondary | ICD-10-CM | POA: Diagnosis not present

## 2019-03-08 DIAGNOSIS — I69298 Other sequelae of other nontraumatic intracranial hemorrhage: Secondary | ICD-10-CM | POA: Diagnosis not present

## 2019-03-10 DIAGNOSIS — I69298 Other sequelae of other nontraumatic intracranial hemorrhage: Secondary | ICD-10-CM | POA: Diagnosis not present

## 2019-03-10 DIAGNOSIS — I69220 Aphasia following other nontraumatic intracranial hemorrhage: Secondary | ICD-10-CM | POA: Diagnosis not present

## 2019-03-10 DIAGNOSIS — R262 Difficulty in walking, not elsewhere classified: Secondary | ICD-10-CM | POA: Diagnosis not present

## 2019-03-10 DIAGNOSIS — R630 Anorexia: Secondary | ICD-10-CM | POA: Diagnosis not present

## 2019-03-10 DIAGNOSIS — F331 Major depressive disorder, recurrent, moderate: Secondary | ICD-10-CM | POA: Diagnosis not present

## 2019-03-10 DIAGNOSIS — I10 Essential (primary) hypertension: Secondary | ICD-10-CM | POA: Diagnosis not present

## 2019-03-11 DIAGNOSIS — R630 Anorexia: Secondary | ICD-10-CM | POA: Diagnosis not present

## 2019-03-11 DIAGNOSIS — I10 Essential (primary) hypertension: Secondary | ICD-10-CM | POA: Diagnosis not present

## 2019-03-11 DIAGNOSIS — I69298 Other sequelae of other nontraumatic intracranial hemorrhage: Secondary | ICD-10-CM | POA: Diagnosis not present

## 2019-03-11 DIAGNOSIS — F331 Major depressive disorder, recurrent, moderate: Secondary | ICD-10-CM | POA: Diagnosis not present

## 2019-03-11 DIAGNOSIS — R262 Difficulty in walking, not elsewhere classified: Secondary | ICD-10-CM | POA: Diagnosis not present

## 2019-03-11 DIAGNOSIS — I69220 Aphasia following other nontraumatic intracranial hemorrhage: Secondary | ICD-10-CM | POA: Diagnosis not present

## 2019-03-14 DIAGNOSIS — R262 Difficulty in walking, not elsewhere classified: Secondary | ICD-10-CM | POA: Diagnosis not present

## 2019-03-14 DIAGNOSIS — I69220 Aphasia following other nontraumatic intracranial hemorrhage: Secondary | ICD-10-CM | POA: Diagnosis not present

## 2019-03-14 DIAGNOSIS — I69298 Other sequelae of other nontraumatic intracranial hemorrhage: Secondary | ICD-10-CM | POA: Diagnosis not present

## 2019-03-14 DIAGNOSIS — F331 Major depressive disorder, recurrent, moderate: Secondary | ICD-10-CM | POA: Diagnosis not present

## 2019-03-14 DIAGNOSIS — R630 Anorexia: Secondary | ICD-10-CM | POA: Diagnosis not present

## 2019-03-14 DIAGNOSIS — I10 Essential (primary) hypertension: Secondary | ICD-10-CM | POA: Diagnosis not present

## 2019-03-16 ENCOUNTER — Emergency Department (HOSPITAL_COMMUNITY): Payer: Medicare Other

## 2019-03-16 ENCOUNTER — Other Ambulatory Visit: Payer: Self-pay

## 2019-03-16 ENCOUNTER — Emergency Department (HOSPITAL_COMMUNITY)
Admission: EM | Admit: 2019-03-16 | Discharge: 2019-03-16 | Disposition: A | Discharge status: Home / Self Care | Payer: Medicare Other | Attending: Emergency Medicine | Admitting: Emergency Medicine

## 2019-03-16 ENCOUNTER — Encounter (HOSPITAL_COMMUNITY): Payer: Self-pay

## 2019-03-16 DIAGNOSIS — I6203 Nontraumatic chronic subdural hemorrhage: Secondary | ICD-10-CM | POA: Insufficient documentation

## 2019-03-16 DIAGNOSIS — S0003XA Contusion of scalp, initial encounter: Secondary | ICD-10-CM | POA: Diagnosis not present

## 2019-03-16 DIAGNOSIS — Z5309 Procedure and treatment not carried out because of other contraindication: Secondary | ICD-10-CM

## 2019-03-16 DIAGNOSIS — Z9181 History of falling: Secondary | ICD-10-CM | POA: Diagnosis not present

## 2019-03-16 DIAGNOSIS — Y999 Unspecified external cause status: Secondary | ICD-10-CM | POA: Insufficient documentation

## 2019-03-16 DIAGNOSIS — W19XXXA Unspecified fall, initial encounter: Secondary | ICD-10-CM | POA: Diagnosis not present

## 2019-03-16 DIAGNOSIS — E860 Dehydration: Secondary | ICD-10-CM | POA: Diagnosis not present

## 2019-03-16 DIAGNOSIS — Z7902 Long term (current) use of antithrombotics/antiplatelets: Secondary | ICD-10-CM | POA: Insufficient documentation

## 2019-03-16 DIAGNOSIS — F331 Major depressive disorder, recurrent, moderate: Secondary | ICD-10-CM | POA: Diagnosis not present

## 2019-03-16 DIAGNOSIS — R4182 Altered mental status, unspecified: Secondary | ICD-10-CM | POA: Diagnosis not present

## 2019-03-16 DIAGNOSIS — S0101XA Laceration without foreign body of scalp, initial encounter: Secondary | ICD-10-CM

## 2019-03-16 DIAGNOSIS — I10 Essential (primary) hypertension: Secondary | ICD-10-CM | POA: Diagnosis not present

## 2019-03-16 DIAGNOSIS — W1830XA Fall on same level, unspecified, initial encounter: Secondary | ICD-10-CM | POA: Diagnosis not present

## 2019-03-16 DIAGNOSIS — G3185 Corticobasal degeneration: Secondary | ICD-10-CM | POA: Diagnosis not present

## 2019-03-16 DIAGNOSIS — Z87891 Personal history of nicotine dependence: Secondary | ICD-10-CM | POA: Insufficient documentation

## 2019-03-16 DIAGNOSIS — Y92009 Unspecified place in unspecified non-institutional (private) residence as the place of occurrence of the external cause: Secondary | ICD-10-CM | POA: Diagnosis not present

## 2019-03-16 DIAGNOSIS — R404 Transient alteration of awareness: Secondary | ICD-10-CM | POA: Diagnosis not present

## 2019-03-16 DIAGNOSIS — R58 Hemorrhage, not elsewhere classified: Secondary | ICD-10-CM | POA: Diagnosis not present

## 2019-03-16 DIAGNOSIS — S4991XA Unspecified injury of right shoulder and upper arm, initial encounter: Secondary | ICD-10-CM | POA: Diagnosis not present

## 2019-03-16 DIAGNOSIS — R29810 Facial weakness: Secondary | ICD-10-CM | POA: Diagnosis not present

## 2019-03-16 DIAGNOSIS — Z982 Presence of cerebrospinal fluid drainage device: Secondary | ICD-10-CM | POA: Diagnosis not present

## 2019-03-16 DIAGNOSIS — Z7982 Long term (current) use of aspirin: Secondary | ICD-10-CM | POA: Insufficient documentation

## 2019-03-16 DIAGNOSIS — Y939 Activity, unspecified: Secondary | ICD-10-CM | POA: Diagnosis not present

## 2019-03-16 DIAGNOSIS — S065X0A Traumatic subdural hemorrhage without loss of consciousness, initial encounter: Secondary | ICD-10-CM | POA: Diagnosis not present

## 2019-03-16 DIAGNOSIS — M25511 Pain in right shoulder: Secondary | ICD-10-CM | POA: Diagnosis not present

## 2019-03-16 DIAGNOSIS — R262 Difficulty in walking, not elsewhere classified: Secondary | ICD-10-CM | POA: Diagnosis not present

## 2019-03-16 DIAGNOSIS — Z79899 Other long term (current) drug therapy: Secondary | ICD-10-CM | POA: Diagnosis not present

## 2019-03-16 DIAGNOSIS — S299XXA Unspecified injury of thorax, initial encounter: Secondary | ICD-10-CM | POA: Diagnosis not present

## 2019-03-16 DIAGNOSIS — Z8673 Personal history of transient ischemic attack (TIA), and cerebral infarction without residual deficits: Secondary | ICD-10-CM | POA: Insufficient documentation

## 2019-03-16 DIAGNOSIS — R4781 Slurred speech: Secondary | ICD-10-CM | POA: Diagnosis not present

## 2019-03-16 DIAGNOSIS — R630 Anorexia: Secondary | ICD-10-CM | POA: Diagnosis not present

## 2019-03-16 DIAGNOSIS — I69220 Aphasia following other nontraumatic intracranial hemorrhage: Secondary | ICD-10-CM | POA: Diagnosis not present

## 2019-03-16 DIAGNOSIS — I69298 Other sequelae of other nontraumatic intracranial hemorrhage: Secondary | ICD-10-CM | POA: Diagnosis not present

## 2019-03-16 LAB — CBC
HCT: 41.6 % (ref 36.0–46.0)
Hemoglobin: 14.3 g/dL (ref 12.0–15.0)
MCH: 31.8 pg (ref 26.0–34.0)
MCHC: 34.4 g/dL (ref 30.0–36.0)
MCV: 92.7 fL (ref 80.0–100.0)
Platelets: 211 10*3/uL (ref 150–400)
RBC: 4.49 MIL/uL (ref 3.87–5.11)
RDW: 12.4 % (ref 11.5–15.5)
WBC: 5.9 10*3/uL (ref 4.0–10.5)
nRBC: 0 % (ref 0.0–0.2)

## 2019-03-16 LAB — RAPID URINE DRUG SCREEN, HOSP PERFORMED
Amphetamines: NOT DETECTED
Barbiturates: NOT DETECTED
Benzodiazepines: NOT DETECTED
Cocaine: NOT DETECTED
Opiates: NOT DETECTED
Tetrahydrocannabinol: NOT DETECTED

## 2019-03-16 LAB — PROTIME-INR
INR: 1 (ref 0.8–1.2)
Prothrombin Time: 13.3 seconds (ref 11.4–15.2)

## 2019-03-16 LAB — URINALYSIS, ROUTINE W REFLEX MICROSCOPIC
Bilirubin Urine: NEGATIVE
Glucose, UA: NEGATIVE mg/dL
Hgb urine dipstick: NEGATIVE
Ketones, ur: NEGATIVE mg/dL
Leukocytes,Ua: NEGATIVE
Nitrite: NEGATIVE
Protein, ur: NEGATIVE mg/dL
Specific Gravity, Urine: 1.016 (ref 1.005–1.030)
pH: 5 (ref 5.0–8.0)

## 2019-03-16 LAB — I-STAT CHEM 8, ED
BUN: 23 mg/dL (ref 8–23)
Calcium, Ion: 1.17 mmol/L (ref 1.15–1.40)
Chloride: 103 mmol/L (ref 98–111)
Creatinine, Ser: 0.8 mg/dL (ref 0.44–1.00)
Glucose, Bld: 119 mg/dL — ABNORMAL HIGH (ref 70–99)
HCT: 42 % (ref 36.0–46.0)
Hemoglobin: 14.3 g/dL (ref 12.0–15.0)
Potassium: 4.2 mmol/L (ref 3.5–5.1)
Sodium: 138 mmol/L (ref 135–145)
TCO2: 25 mmol/L (ref 22–32)

## 2019-03-16 LAB — COMPREHENSIVE METABOLIC PANEL
ALT: 16 U/L (ref 0–44)
AST: 16 U/L (ref 15–41)
Albumin: 3.6 g/dL (ref 3.5–5.0)
Alkaline Phosphatase: 52 U/L (ref 38–126)
Anion gap: 10 (ref 5–15)
BUN: 19 mg/dL (ref 8–23)
CO2: 23 mmol/L (ref 22–32)
Calcium: 9.1 mg/dL (ref 8.9–10.3)
Chloride: 105 mmol/L (ref 98–111)
Creatinine, Ser: 0.93 mg/dL (ref 0.44–1.00)
GFR calc Af Amer: >60 mL/min (ref >60)
GFR calc non Af Amer: 58 mL/min — ABNORMAL LOW (ref >60)
Glucose, Bld: 123 mg/dL — ABNORMAL HIGH (ref 70–99)
Potassium: 4.3 mmol/L (ref 3.5–5.1)
Sodium: 138 mmol/L (ref 135–145)
Total Bilirubin: 0.7 mg/dL (ref 0.3–1.2)
Total Protein: 6 g/dL — ABNORMAL LOW (ref 6.5–8.1)

## 2019-03-16 LAB — DIFFERENTIAL
Abs Immature Granulocytes: 0.02 10*3/uL (ref 0.00–0.07)
Basophils Absolute: 0.1 10*3/uL (ref 0.0–0.1)
Basophils Relative: 1 %
Eosinophils Absolute: 0.1 10*3/uL (ref 0.0–0.5)
Eosinophils Relative: 1 %
Immature Granulocytes: 0 %
Lymphocytes Relative: 12 %
Lymphs Abs: 0.7 10*3/uL (ref 0.7–4.0)
Monocytes Absolute: 0.5 10*3/uL (ref 0.1–1.0)
Monocytes Relative: 8 %
Neutro Abs: 4.5 10*3/uL (ref 1.7–7.7)
Neutrophils Relative %: 78 %

## 2019-03-16 LAB — CBG MONITORING, ED: Glucose-Capillary: 109 mg/dL — ABNORMAL HIGH (ref 70–99)

## 2019-03-16 LAB — APTT: aPTT: 24 seconds (ref 24–36)

## 2019-03-16 LAB — ETHANOL: Alcohol, Ethyl (B): <10 mg/dL (ref <10)

## 2019-03-16 MED ORDER — LIDOCAINE-EPINEPHRINE (PF) 2 %-1:200000 IJ SOLN
INTRAMUSCULAR | Status: AC
  Administered 2019-03-16: 20 mL via INTRADERMAL
  Filled 2019-03-16: qty 20

## 2019-03-16 MED ORDER — LIDOCAINE-EPINEPHRINE (PF) 2 %-1:200000 IJ SOLN
20.0000 mL | Freq: Once | INTRAMUSCULAR | Status: AC
  Administered 2019-03-16: 14:00:00 20 mL via INTRADERMAL

## 2019-03-16 NOTE — ED Triage Notes (Signed)
Pt to ED via [facility] with Right facial droop, Right side weakness, HTN, AMS and fall. Lac to posterior head, bleeding controlled, wrapped prior to arrival. LKW 2200, CBG 109 History of a stroke w/Right side weakness.

## 2019-03-16 NOTE — ED Notes (Signed)
Pt now complaining of right shoulder pain and requesting an xray. MD made aware.

## 2019-03-16 NOTE — Discharge Instructions (Addendum)
It is fine to get the area wet read of staples but do not fully immerse it under water so no swimming or scuba diving.  Please return for redness drainage or fever.  The staples should be removed sometime between 5 and 7 days.  Be done here at urgent care or at the family doctor's office.

## 2019-03-16 NOTE — ED Provider Notes (Addendum)
[facility] EMERGENCY DEPARTMENT Provider Note   CSN: [id number] Arrival date & time: 03/16/19  1135     History   Chief Complaint Chief Complaint  Patient presents with  . Code Stroke    HPI Brandi [name] is a 78 y.o. female.     78 yo F with a chief complaints of sudden onset right-sided weakness and right-sided facial droop.  Having some aphasia as well.  Also had a fall at home.  Arrived as a code stroke.  Level 5 caveat acuity of condition.  The history is provided by the patient and the EMS personnel.  Illness Severity:  Severe Onset quality:  Sudden Duration:  2 hours Timing:  Constant Progression:  Unchanged Chronicity:  New Associated symptoms: no chest pain, no congestion, no fever, no headaches, no myalgias, no nausea, no rhinorrhea, no shortness of breath, no vomiting and no wheezing     Past Medical History:  Diagnosis Date  . Diverticulosis   . Essential hypertension, benign   . Irritable bowel syndrome   . Mitral regurgitation    Mild to moderate 09/2012  . Mixed hyperlipidemia   . PSVT (paroxysmal supraventricular tachycardia) ([place])    Brief by monitoring  . Sinus bradycardia   . Stroke [facility]) 10/2013   a Little Right sided weakness- speech a little "off' - "writing bad"    Patient Active Problem List   Diagnosis Date Noted  . Normal pressure hydrocephalus ([place]) 03/18/2017  . Hydrocephalus ([place]) 03/18/2017  . Healthcare maintenance 05/30/2015  . Essential hypertension, benign 12/23/2012  . PVC's (premature ventricular contractions) 12/23/2012  . SVT (supraventricular tachycardia) ([place]) 12/23/2012  . Fatigue 12/23/2012  . Hyperlipidemia 08/15/2010    Past Surgical History:  Procedure Laterality Date  . ABDOMINAL HYSTERECTOMY  06-02-2008  . APPENDECTOMY  12/14/1949  . CARPAL TUNNEL RELEASE Right 07/15/2016   Procedure: RIGHT CARPAL TUNNEL RELEASE;  Surgeon: Daryll Brod, MD;  Location: [facility];  Service:  Orthopedics;  Laterality: Right;  . CATARACT EXTRACTION W/ INTRAOCULAR LENS IMPLANT    . COLONOSCOPY    . EYE SURGERY     cataract  . ORIF HIP FRACTURE Right 04/01/2003  . 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      Home Medications    Prior to Admission medications   Medication Sig Start Date End Date Taking? Authorizing Provider  acetaminophen (TYLENOL) 500 MG tablet Take 500 mg by mouth every 8 (eight) hours as needed for mild pain, moderate pain or headache.    Yes [provider]  aspirin 81 MG EC tablet Take 81 mg by mouth at bedtime.    Yes [provider]  Cholecalciferol (VITAMIN D3) 50 MCG (2000 UT) TABS Take 2,000 Units by mouth daily.   Yes [provider]  clopidogrel (PLAVIX) 75 MG tablet Take 75 mg by mouth daily.   Yes [provider]  dicyclomine (BENTYL) 10 MG capsule Take 10 mg by mouth 2 (two) times daily.    Yes [provider]  fish oil-omega-3 fatty acids 1000 MG capsule Take 1 g by mouth daily.    Yes [provider]  hydrochlorothiazide (MICROZIDE) 12.5 MG capsule Take 1 capsule (12.5 mg total) by mouth daily. 01/13/13  Yes Satira Sark, MD  meclizine (ANTIVERT) 12.5  MG tablet Take 1 tablet (12.5 mg total) by mouth 3 (three) times daily as needed. Patient taking differently: Take 12.5 mg by mouth 3 (three) times daily as needed for dizziness.  09/08/13  Yes Vernie Shanks, MD  megestrol (MEGACE) 20 MG tablet Take 20 mg by mouth every other day.   Yes [provider]  metoprolol succinate (TOPROL-XL) 25 MG 24 hr tablet Take 25 mg by mouth daily.   Yes [provider]  Multiple Vitamin (MULTIVITAMIN) tablet Take 1 tablet by mouth daily.   Yes [provider]  pravastatin (PRAVACHOL) 20 MG  tablet Take 1 tablet (20 mg total) by mouth daily. Patient taking differently: Take 20 mg by mouth at bedtime.  09/11/13  Yes Vernie Shanks, MD  sertraline (ZOLOFT) 50 MG tablet Take 50 mg by mouth daily. 10/20/17  Yes [provider]  acetaminophen-codeine (TYLENOL #3) 300-30 MG tablet Take 1 tablet by mouth every 6 (six) hours as needed for moderate pain. Patient not taking: Reported on 06/15/2018 03/20/17   Ashok Pall, MD  zolpidem (AMBIEN) 5 MG tablet Take 5 mg by mouth at bedtime as needed for sleep.  11/16/17   [provider]    Family History Family History  Problem Relation Age of Onset  . Stroke Mother   . Asthma Father   . COPD Father   . CAD Sister     Social History Social History   Tobacco Use  . Smoking status: Former Smoker    Packs/day: 1.00    Years: 25.00    Pack years: 25.00    Types: Cigarettes    Start date: 12/17/1973    Quit date: 05/26/1998    Years since quitting: 20.8  . Smokeless tobacco: Never Used  Substance Use Topics  . Alcohol use: No    Alcohol/week: 0.0 standard drinks  . Drug use: No     Allergies   Sulfa antibiotics, Pseudoephedrine hcl, and Sudafed [pseudoephedrine hcl]   Review of Systems Review of Systems  Unable to perform ROS: Acuity of condition  Constitutional: Negative for chills and fever.  HENT: Negative for congestion and rhinorrhea.   Eyes: Negative for redness and visual disturbance.  Respiratory: Negative for shortness of breath and wheezing.   Cardiovascular: Negative for chest pain and palpitations.  Gastrointestinal: Negative for nausea and vomiting.  Genitourinary: Negative for dysuria and urgency.  Musculoskeletal: Negative for arthralgias and myalgias.  Skin: Negative for pallor and wound.  Neurological: Positive for speech difficulty and weakness. Negative for dizziness and headaches.     Physical Exam Updated Vital Signs BP (!) 128/56 (BP Location: Right Arm)   Pulse (!) 52   Temp  97.8 F (36.6 C) (Temporal)   Resp 18   Ht 5\' 5"  (1.651 m)   Wt 63.5 kg   SpO2 96%   BMI 23.30 kg/m   Physical Exam Vitals signs and nursing note reviewed.  Constitutional:      General: She is not in acute distress.    Appearance: She is well-developed. She is not diaphoretic.  HENT:     Head: Normocephalic and atraumatic.  Eyes:     Pupils: Pupils are equal, round, and reactive to light.  Neck:     Musculoskeletal: Normal range of motion and neck supple.  Cardiovascular:     Rate and Rhythm: Normal rate and regular rhythm.     Heart sounds: No murmur. No friction rub. No gallop.   Pulmonary:  Effort: Pulmonary effort is normal.     Breath sounds: No wheezing or rales.  Abdominal:     General: There is no distension.     Palpations: Abdomen is soft.     Tenderness: There is no abdominal tenderness.  Musculoskeletal:        General: No tenderness.     Comments: Contractures to lower extremities  Skin:    General: Skin is warm and dry.  Neurological:     Mental Status: She is alert.     Comments: Right sided facial droop, right sided weakness.   Psychiatric:        Behavior: Behavior normal.      ED Treatments / Results  Labs (all labs ordered are listed, but only abnormal results are displayed) Labs Reviewed  COMPREHENSIVE METABOLIC PANEL - Abnormal; Notable for the following components:      Result Value   Glucose, Bld 123 (*)    Total Protein 6.0 (*)    GFR calc non Af Amer 58 (*)    All other components within normal limits  I-STAT CHEM 8, ED - Abnormal; Notable for the following components:   Glucose, Bld 119 (*)    All other components within normal limits  CBG MONITORING, ED - Abnormal; Notable for the following components:   Glucose-Capillary 109 (*)    All other components within normal limits  ETHANOL  PROTIME-INR  APTT  CBC  DIFFERENTIAL  RAPID URINE DRUG SCREEN, HOSP PERFORMED  URINALYSIS, ROUTINE W REFLEX MICROSCOPIC    EKG EKG  Interpretation  Date/Time:  Wednesday March 16 2019 12:07:03 EDT Ventricular Rate:  51 PR Interval:    QRS Duration: 98 QT Interval:  444 QTC Calculation: 409 R Axis:   79 Text Interpretation:  Sinus rhythm Minimal ST depression, inferior leads No significant change since last tracing Confirmed by Deno Etienne [phone]) on 03/16/2019 2:16:48 PM   Radiology No results found.  Procedures .Marland KitchenLaceration Repair  Date/Time: 03/16/2019 1:11 PM Performed by: Deno Etienne, DO Authorized by: Deno Etienne, DO   Consent:    Consent obtained:  Verbal   Consent given by:  Patient   Risks discussed:  Infection, pain, poor cosmetic result and poor wound healing   Alternatives discussed:  No treatment and delayed treatment Anesthesia (see MAR for exact dosages):    Anesthesia method:  Local infiltration   Local anesthetic:  Lidocaine 2% WITH epi Laceration details:    Location:  Scalp   Scalp location:  L parietal   Length (cm):  2.8 Repair type:    Repair type:  Simple Pre-procedure details:    Preparation:  Patient was prepped and draped in usual sterile fashion Exploration:    Hemostasis achieved with:  Epinephrine and direct pressure   Wound exploration: entire depth of wound probed and visualized     Wound extent: no foreign bodies/material noted and no underlying fracture noted     Contaminated: no   Treatment:    Area cleansed with:  Saline   Amount of cleaning:  Standard   Irrigation solution:  Sterile saline   Irrigation volume:  40   Irrigation method:  Syringe Skin repair:    Repair method:  Staples   Number of staples:  3 Approximation:    Approximation:  Close Post-procedure details:    Dressing:  Open (no dressing)   Patient tolerance of procedure:  Tolerated well, no immediate complications   (including critical care time)  Medications Ordered in ED Medications  lidocaine-EPINEPHrine (XYLOCAINE W/EPI) 2 %-1:200000 (PF) injection 20 mL (20 mLs Intradermal Given  03/16/19 1354)     Initial Impression / Assessment and Plan / ED Course  I have reviewed the triage vital signs and the nursing notes.  Pertinent labs & imaging results that were available during my care of the patient were reviewed by me and considered in my medical decision making (see chart for details).        78 yo F with a chief complaints of right-sided facial droop and right-sided weakness.  Arrived as a code stroke.    After discussion with the family it sounds like the patient has chronic slurred speech that is unchanged as well as she has a chronic right-sided weakness.  It seems that EMS did not get this in the history and this is called a code stroke without verification of the symptom onset.  Code stroke was called off.  Patient does not appear that she walks typically and it sounds like she lost her balance from a chair and fell.  She has a scalp laceration that I repaired at bedside.  Neurology was recommending follow-up with an outpatient neurologist he felt this should be seen by a movement disorder or a memory specialist.  PCP follow-up.  I noticed that the patient had an MRI of the brain ordered by the neurologist.  I discussed this with them and they felt that the patient should get an MRI prior to discharge.   Awaiting MRI, signed out to Dr. Billy Fischer please see her note for further details care in ED.  The patients results and plan were reviewed and discussed.   Any x-rays performed were independently reviewed by myself.   Differential diagnosis were considered with the presenting HPI.  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

## 2019-03-16 NOTE — ED Notes (Signed)
Called [facility] for pt transport  

## 2019-03-16 NOTE — ED Notes (Signed)
Called [facility] to cancel transport per RN

## 2019-03-16 NOTE — Code Documentation (Signed)
78yo female arriving to [facility] via [place] at 1135. Patient from home where she pushed her life alert this morning and was found down with altered mental status and bleeding from her head. EMS reports fall with right facial droop, right sided weakness and difficulty speaking. Patient's son reports LKW 03/15/2019 at 51, however, later notes speech deficit has been occurring for some time. Stroke team at the bedside on patient arrival. Labs drawn and patient to CT with team. CT completed. NIHSS 7, see documentation for details and code stroke times. Patient with right facial droop, aphasia and dysarthria on exam. Speech clearer following CT but continues to be difficulty to understand. Patient with h/o SDH, VP shunt and hypertension. No acute stroke treatment at this time. Code stroke canceled. Bedside handoff with ED RN Magda Paganini.

## 2019-03-16 NOTE — ED Notes (Signed)
Patient verbalizes understanding of discharge instructions. Opportunity for questioning and answers were provided. Armband removed by staff, pt discharged from ED in wheelchair to home with family.   

## 2019-03-16 NOTE — Consult Note (Addendum)
Neurology Consultation  Reason for Consult: Code stroke  Referring Physician: Dr. Tyrone Nine  History is obtained from: Son  HPI: Brandi [name] is a 78 y.o. female presenting after a fall at home. EMS activated a Code Stroke due to LUE weakness and abnormal speech. 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  Son had talked to his mother at 51 hrs. last night and noted that she was at baseline.  Today she fell, hit her life alert and EMS was called to her house.  At that time they thought she was confused and noted a right facial droop thus brought patient to the ED.  While in the ED [place] the [place] patient was noticed to have "confusion" as she did not seem to know her name, the month, and was unable to tell us if her speech was normal; this was later revealed most likely to be due to difficulty understanding her severely apraxic speech. Patient son was called while the patient was in CT - he stated that he was told she had a stroke back in 2015 however he also described that the right arm rigidity and speech abnormality was a slow, progressive process. Of note, no old stroke was seen on CT. He stated that her gait abnormality has also been a slow progressive process and she has had multiple falls.  We discussed her mRS and he states that she does need help with bathing and doing bills, is able to prepare foods in a microwave but otherwise does not cook.   ED course -Physical exam -CT of head  Chart review-on 03/18/2017, patient was seen by Dr. Christella Noa for evaluation of normal pressure hydrocephalus.  As she was having problems with her gait over the last few months.  At that point she had an MRI which showed ventriculomegaly and transependymal signal abnormality suggestive of NPH.  At that point patient had a shunt placed; son states her walking improved afterwards  but more recently has started to worsen again.  Patient recently also had a fall which resulted in a small subdural hematoma along the left extra-axial region which at this point in time has decreased in size based upon CT obtained today.  MRI brain in the ED:  1. Chronic VP shunt without evidence of shunt malfunction. 2. Chronic brain atrophy and small-vessel ischemic changes. No sign of recent infarction. 3. Thin chronic left subdural collection, maximal thickness 3 mm, without significant mass effect.   LKW: 2000 hrs. on 10 09/17/2018 tpa given?: no, no acute new deficits noted Premorbid modified Rankin scale (mRS): 2 NIH stroke scale of 7   ROS: A 14 point ROS was performed and is negative except as noted in the HPI.   Past Medical History:  Diagnosis Date  . Diverticulosis   . Essential hypertension, benign   . Irritable bowel syndrome   . Mitral regurgitation    Mild to moderate 09/2012  . Mixed hyperlipidemia   . PSVT (paroxysmal supraventricular tachycardia) ([place])    Brief by monitoring  . Sinus bradycardia   . Stroke [facility]) 10/2013   a Little Right sided weakness- speech a little "off' - "writing bad"    Family History  Problem Relation Age of Onset  . Stroke Mother   . Asthma Father   . COPD Father   . CAD Sister     Social History:   reports that she quit  smoking about 20 years ago. Her smoking use included cigarettes. She started smoking about 45 years ago. She has a 25.00 pack-year smoking history. She has never used smokeless tobacco. She reports that she does not drink alcohol or use drugs.  Medications  Current Facility-Administered Medications:  .  lidocaine-EPINEPHrine (XYLOCAINE W/EPI) 2 %-1:200000 (PF) injection 20 mL, 20 mL, Intradermal, Once, Deno Etienne, DO  Current Outpatient Medications:  .  acetaminophen (TYLENOL) 500 MG tablet, Take 500 mg by mouth daily as needed for moderate pain or headache., Disp: , Rfl:  .  acetaminophen-codeine (TYLENOL  #3) 300-30 MG tablet, Take 1 tablet by mouth every 6 (six) hours as needed for moderate pain. (Patient not taking: Reported on 06/15/2018), Disp: 30 tablet, Rfl: 0 .  aspirin 81 MG EC tablet, Take 81 mg by mouth daily.  , Disp: , Rfl:  .  Cholecalciferol (VITAMIN D) 2000 UNITS CAPS, Take 2,000 Units by mouth daily. , Disp: , Rfl:  .  clopidogrel (PLAVIX) 75 MG tablet, Take 75 mg by mouth daily., Disp: , Rfl:  .  dicyclomine (BENTYL) 10 MG capsule, Take 10 mg by mouth 2 (two) times daily. , Disp: , Rfl:  .  fish oil-omega-3 fatty acids 1000 MG capsule, Take 1 g by mouth daily. , Disp: , Rfl:  .  hydrochlorothiazide (MICROZIDE) 12.5 MG capsule, Take 1 capsule (12.5 mg total) by mouth daily., Disp: 90 capsule, Rfl: 3 .  meclizine (ANTIVERT) 12.5 MG tablet, Take 1 tablet (12.5 mg total) by mouth 3 (three) times daily as needed. (Patient taking differently: Take 12.5 mg by mouth 3 (three) times daily as needed for dizziness. ), Disp: 90 tablet, Rfl: 1 .  metoprolol succinate (TOPROL-XL) 25 MG 24 hr tablet, Take 25 mg by mouth daily., Disp: , Rfl:  .  Multiple Vitamin (MULTIVITAMIN) tablet, Take 1 tablet by mouth daily., Disp: , Rfl:  .  pravastatin (PRAVACHOL) 20 MG tablet, Take 1 tablet (20 mg total) by mouth daily., Disp: 30 tablet, Rfl: 5 .  sertraline (ZOLOFT) 50 MG tablet, Take 50 mg by mouth daily., Disp: , Rfl: 0 .  zolpidem (AMBIEN) 5 MG tablet, Take 5 mg by mouth at bedtime as needed., Disp: , Rfl: 0   Exam: Current vital signs: BP 131/62 (BP Location: Left Arm)   Pulse (!) 49   Temp 97.8 F (36.6 C) (Temporal)   Resp 18   Ht 5\' 5"  (1.651 m)   Wt 63.5 kg   SpO2 96%   BMI 23.30 kg/m  Vital signs in last 24 hours: Temp:  [97.8 F (36.6 C)] 97.8 F (36.6 C) (10/21 1217) Pulse Rate:  [49] 49 (10/21 1217) Resp:  [18] 18 (10/21 1217) BP: (131)/(62) 131/62 (10/21 1217) SpO2:  [96 %] 96 % (10/21 1217) Weight:  [63.5 kg] 63.5 kg (10/21 1218)  Physical Exam  Constitutional: Appears  well-developed and well-nourished.  Psych: Affect appropriate to situation Eyes: No scleral injection HENT: No OP obstrucion Head: Normocephalic.  Cardiovascular: Normal rate and regular rhythm.  Respiratory: Effort normal, non-labored breathing GI: Soft.  No distension. There is no tenderness.  Skin: WDI-head wrap secondary to skin tear after fall  Neuro: Mental Status: Patient is awake, alert, she is not oriented to her name, place, month and is unable to follow three-step commands. Severely apraxic speech is noted.  Patient is unable to give a history No signs of aphasia or neglect Voice is very apraxic/dysarthric Cranial Nerves: II: Visual Fields are full.  III,IV,  VI: EOMI without ptosis or diplopia. Pupils equal, round and reactive to light V: Facial sensation is symmetric to temperature VII: Facial movement is symmetric, with a mild right facial droop at rest VIII: hearing is intact to voice X: Palate elevates symmetrically XI: Shoulder shrug is symmetric. XII: tongue is midline without atrophy or fasciculations.  Motor: Patient has increased tone throughout significantly worse to RUE which is held in flexion. Decreased coordination and fine motor movements of right hand. Strength testing is 5/5 bilaterally in upper and lower extremities, with bradykinesia, significantly worse to RUE. There is cogwheel rigidity of BUE, worse on the right. Right arm tremor is noted.  Sensory: Decreased sensation on the right arm and right leg Deep Tendon Reflexes: 2+ and brisk throughout Plantars: Toes are downgoing bilaterally.  Cerebellar: Decreased coordination with the right hand, and arm. No cerebellar ataxia is noted.  Gait: Deferred due to falls risk concerns  Labs I have reviewed labs in epic and the results pertinent to this consultation are:   CBC    Component Value Date/Time   WBC 5.9 03/16/2019 1136   RBC 4.49 03/16/2019 1136   HGB 14.3 03/16/2019 1152   HCT 42.0  03/16/2019 1152   PLT 211 03/16/2019 1136   MCV 92.7 03/16/2019 1136   MCH 31.8 03/16/2019 1136   MCHC 34.4 03/16/2019 1136   RDW 12.4 03/16/2019 1136   LYMPHSABS 0.7 03/16/2019 1136   MONOABS 0.5 03/16/2019 1136   EOSABS 0.1 03/16/2019 1136   BASOSABS 0.1 03/16/2019 1136    CMP     Component Value Date/Time   NA 138 03/16/2019 1152   NA 138 09/08/2013 1147   K 4.2 03/16/2019 1152   CL 103 03/16/2019 1152   CO2 23 03/16/2019 1136   GLUCOSE 119 (H) 03/16/2019 1152   BUN 23 03/16/2019 1152   BUN 15 09/08/2013 1147   CREATININE 0.80 03/16/2019 1152   CREATININE 0.82 08/24/2012 1212   CALCIUM 9.1 03/16/2019 1136   PROT 6.0 (L) 03/16/2019 1136   PROT 6.8 09/08/2013 1147   ALBUMIN 3.6 03/16/2019 1136   ALBUMIN 4.1 09/08/2013 1147   AST 16 03/16/2019 1136   ALT 16 03/16/2019 1136   ALKPHOS 52 03/16/2019 1136   BILITOT 0.7 03/16/2019 1136   GFRNONAA 58 (L) 03/16/2019 1136   GFRNONAA 71 08/24/2012 1212   GFRAA >60 03/16/2019 1136   GFRAA 82 08/24/2012 1212    Lipid Panel     Component Value Date/Time   CHOL 228 (H) 09/08/2013 1147   CHOL 154 08/24/2012 1212   TRIG 218 (H) 09/08/2013 1147   TRIG 131 08/24/2012 1212   HDL 71 09/08/2013 1147   HDL 68 08/24/2012 1212   LDLCALC 113 (H) 09/08/2013 1147   LDLCALC 60 08/24/2012 1212     Imaging I have reviewed the images obtained:  CT-scan of the brain: -- No acute ischemic infarct.  The left extra-axial hemorrhage has decreased in size since prior exam. A 4 mm focus over the left middle frontal gyrus may represent a small focus of acute on chronic hemorrhage. -- A right frontal ventriculostomy catheter is in place. Ventricular size has increased slightly since the prior exam. -- No acute cortical infarcts are present. Diffuse white matter changes are stable. Brainstem and cerebellum are unremarkable.  MRI examination of the brain- See HPI  Etta Quill PA-C [facility] [id number] 03/16/2019, 1:30 PM     Assessment: 78 year old female presenting to the ED as a code stroke after  fall. Code Stroke was called due to what was felt by EMS to be possible acute dysarthria, confusion and right sided weakness.   1. On discussion with son patient's apraxic speech is at her baseline and difficulty with memory is also baseline.  All of her current deficits are per son at baseline, thus code stroke was canceled.   2. 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 The pattern of posterior frontal and parietal asymmetric atrophy on imaging is also compatible with CBD.    Recommendations: - Frequent neuro checks -- Will need to follow up with a Neurologist with subspecialty expertise in either movement disorders or dementia to assess for possible CBD.   I have seen and examined the patient. I have formulated the assessment and plan. My examination findings were observed and documented by Etta Quill, PA.  Electronically signed: Dr. Kerney Elbe

## 2019-03-21 DIAGNOSIS — I69220 Aphasia following other nontraumatic intracranial hemorrhage: Secondary | ICD-10-CM | POA: Diagnosis not present

## 2019-03-21 DIAGNOSIS — F331 Major depressive disorder, recurrent, moderate: Secondary | ICD-10-CM | POA: Diagnosis not present

## 2019-03-21 DIAGNOSIS — R262 Difficulty in walking, not elsewhere classified: Secondary | ICD-10-CM | POA: Diagnosis not present

## 2019-03-21 DIAGNOSIS — I69298 Other sequelae of other nontraumatic intracranial hemorrhage: Secondary | ICD-10-CM | POA: Diagnosis not present

## 2019-03-21 DIAGNOSIS — I10 Essential (primary) hypertension: Secondary | ICD-10-CM | POA: Diagnosis not present

## 2019-03-21 DIAGNOSIS — R630 Anorexia: Secondary | ICD-10-CM | POA: Diagnosis not present

## 2019-03-23 DIAGNOSIS — R262 Difficulty in walking, not elsewhere classified: Secondary | ICD-10-CM | POA: Diagnosis not present

## 2019-03-23 DIAGNOSIS — R630 Anorexia: Secondary | ICD-10-CM | POA: Diagnosis not present

## 2019-03-23 DIAGNOSIS — I10 Essential (primary) hypertension: Secondary | ICD-10-CM | POA: Diagnosis not present

## 2019-03-23 DIAGNOSIS — I69220 Aphasia following other nontraumatic intracranial hemorrhage: Secondary | ICD-10-CM | POA: Diagnosis not present

## 2019-03-23 DIAGNOSIS — I69298 Other sequelae of other nontraumatic intracranial hemorrhage: Secondary | ICD-10-CM | POA: Diagnosis not present

## 2019-03-23 DIAGNOSIS — F331 Major depressive disorder, recurrent, moderate: Secondary | ICD-10-CM | POA: Diagnosis not present

## 2019-03-24 DIAGNOSIS — R55 Syncope and collapse: Secondary | ICD-10-CM | POA: Diagnosis not present

## 2019-03-30 DIAGNOSIS — F331 Major depressive disorder, recurrent, moderate: Secondary | ICD-10-CM | POA: Diagnosis not present

## 2019-03-30 DIAGNOSIS — I10 Essential (primary) hypertension: Secondary | ICD-10-CM | POA: Diagnosis not present

## 2019-03-30 DIAGNOSIS — I69298 Other sequelae of other nontraumatic intracranial hemorrhage: Secondary | ICD-10-CM | POA: Diagnosis not present

## 2019-03-30 DIAGNOSIS — I69220 Aphasia following other nontraumatic intracranial hemorrhage: Secondary | ICD-10-CM | POA: Diagnosis not present

## 2019-03-30 DIAGNOSIS — R262 Difficulty in walking, not elsewhere classified: Secondary | ICD-10-CM | POA: Diagnosis not present

## 2019-03-30 DIAGNOSIS — R630 Anorexia: Secondary | ICD-10-CM | POA: Diagnosis not present

## 2019-03-31 ENCOUNTER — Encounter: Payer: Self-pay | Admitting: Neurology

## 2019-03-31 ENCOUNTER — Encounter: Payer: Self-pay | Admitting: *Deleted

## 2019-03-31 ENCOUNTER — Ambulatory Visit (INDEPENDENT_AMBULATORY_CARE_PROVIDER_SITE_OTHER): Payer: Medicare Other | Admitting: Neurology

## 2019-03-31 ENCOUNTER — Other Ambulatory Visit: Payer: Self-pay

## 2019-03-31 VITALS — BP 127/72 | HR 53 | Temp 97.6°F | Ht 65.0 in | Wt 117.0 lb

## 2019-03-31 DIAGNOSIS — G249 Dystonia, unspecified: Secondary | ICD-10-CM | POA: Diagnosis not present

## 2019-03-31 DIAGNOSIS — G8111 Spastic hemiplegia affecting right dominant side: Secondary | ICD-10-CM | POA: Insufficient documentation

## 2019-03-31 DIAGNOSIS — R269 Unspecified abnormalities of gait and mobility: Secondary | ICD-10-CM

## 2019-03-31 DIAGNOSIS — R471 Dysarthria and anarthria: Secondary | ICD-10-CM | POA: Diagnosis not present

## 2019-03-31 MED ORDER — CARBIDOPA-LEVODOPA 25-100 MG PO TABS: 1.0000 | ORAL_TABLET | Freq: Three times a day (TID) | ORAL | 11 refills | Status: AC

## 2019-03-31 NOTE — Progress Notes (Signed)
PATIENT: Brandi [name] DOB: April 01, 1941  Chief Complaint  Patient presents with  . Subdural Hematona/Gait Difficulty    She is here with her son, [name].  Hx of normal pressure hydrocephalus with shunt placed.  Follow up from ED visit on 03/16/2019 due to fall.  Reports unsteady, shuffling gait.  MRI brain completed at the hospital.   . Neurosurgery    Ashok Pall, MD - referring provider  . PCP    Neale Burly, MD  . Neck Pain     HISTORICAL  Brandi [name] is a 78 year old female, seen in request by her neurosurgeon Dr. Ashok Pall, [place] and primary care physician Dr. Stoney Bang for evaluation of slurred speech, gait abnormality.  Initial evaluation was on March 31, 2019.  I have reviewed and summarized the referring note from the referring physician.  She had a past medical history of hypertension, hyperlipidemia, was highly functional, then began to have gradual onset gait abnormality, mild slurred speech in 2017, she lives independently, driving, taking care of her daily needs without any difficulty, she fell in 2017, began to seek medical attention at that time, in fall 2018, she woke up 1 day, had a sudden worsening of gait abnormality, MRI of the brain showed evidence of enlarged ventricle with transependymal fluid, he underwent right frontal for VP shunt placement by Dr. 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, cannot fully extend the right upper extremity, he takes small shuffling steps, speech is markedly dysarthric,  Per her son, following right VP shunt placement, she had a significant improvement in her function, reported 80% improvement, she is able to live alone, independent in her daily activity, including driving, she fell again in December 2019, was treated at [facility], CT head without contrast showed acute subdural hematoma overlying the left hemispheric convexity, she was discharged to  rehabilitation, she had mild improvement following that, but was never bounced back to her baseline  Since March 2020, she had slow decline, worsening dysarthria, even her family could barely understand her, worsening gait abnormality, fell easily, she has family check on her a daily basis, she has no seizure, her son and patient deny history of memory loss.  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  REVIEW OF SYSTEMS: Full 14 system review of systems performed and notable only for as above All other review of systems were negative.  ALLERGIES: Allergies  Allergen Reactions  . Sulfa Antibiotics Anaphylaxis  . Pseudoephedrine Hcl Nausea Only  . Sudafed [Pseudoephedrine Hcl] Nausea Only    HOME MEDICATIONS: Current Outpatient Medications  Medication Sig Dispense Refill  . acetaminophen (TYLENOL) 500 MG tablet Take 500 mg by mouth every 8 (eight) hours as needed for mild pain, moderate pain or headache.     Marland Kitchen aspirin 81 MG EC tablet Take 81 mg by mouth at bedtime.     . Cholecalciferol (VITAMIN D3) 50 MCG (2000 UT) TABS Take 2,000 Units by mouth daily.    . clopidogrel (PLAVIX) 75 MG tablet Take 75 mg by mouth daily.    Marland Kitchen dicyclomine (BENTYL) 10 MG capsule Take 10 mg by mouth 2 (two) times daily.     . fish oil-omega-3 fatty acids 1000 MG capsule Take 1 g by mouth daily.     Marland Kitchen  hydrochlorothiazide (MICROZIDE) 12.5 MG capsule Take 1 capsule (12.5 mg total) by mouth daily. 90 capsule 3  . meclizine (ANTIVERT) 12.5 MG tablet Take 1 tablet (12.5 mg total) by mouth 3 (three) times daily as needed. (Patient taking differently: Take 12.5 mg by mouth 3 (three) times daily as needed for dizziness. ) 90 tablet 1  . megestrol (MEGACE) 20 MG tablet Take 20 mg by mouth every other day.     . metoprolol succinate (TOPROL-XL) 25 MG 24 hr tablet Take 25 mg by mouth daily.    . Multiple Vitamin (MULTIVITAMIN) tablet Take 1 tablet by mouth daily.    . pravastatin (PRAVACHOL) 20 MG tablet Take 1 tablet (20 mg total) by mouth daily. (Patient taking differently: Take 20 mg by mouth at bedtime. ) 30 tablet 5  . sertraline (ZOLOFT) 50 MG tablet Take 50 mg by mouth daily.  0  . zolpidem (AMBIEN) 5 MG tablet Take 5 mg by mouth at bedtime as needed for sleep.   0   No current facility-administered medications for this visit.     PAST MEDICAL HISTORY: Past Medical History:  Diagnosis Date  . Diverticulosis   . Essential hypertension, benign   . Gait difficulty   . Irritable bowel syndrome   . Mitral regurgitation    Mild to moderate 09/2012  . Mixed hyperlipidemia   . Normal pressure hydrocephalus (HCC)   . PSVT (paroxysmal supraventricular tachycardia) ([place])    Brief by monitoring  . Sinus bradycardia   . Stroke [facility]) 10/2013   a Little Right sided weakness- speech a little "off' - "writing bad"    PAST SURGICAL HISTORY: Past Surgical History:  Procedure Laterality Date  . ABDOMINAL HYSTERECTOMY  06-02-2008  . APPENDECTOMY  12/14/1949  . CARPAL TUNNEL RELEASE Right 07/15/2016   Procedure: RIGHT CARPAL TUNNEL RELEASE;  Surgeon: Daryll Brod, MD;  Location: [place];  Service: Orthopedics;  Laterality: Right;  . CATARACT EXTRACTION W/ INTRAOCULAR LENS IMPLANT    . COLONOSCOPY    . EYE SURGERY     cataract  . ORIF HIP FRACTURE Right 04/01/2003  . VENTRICULOPERITONEAL SHUNT N/A 03/18/2017   Procedure: SHUNT INSERTION VENTRICULAR-PERITONEAL;  Surgeon: Ashok Pall, MD;  Location: [place];  Service: Neurosurgery;  Laterality: N/A;    FAMILY HISTORY: Family History  Problem Relation Age of Onset  . Stroke Mother   . Aneurysm Mother   . Asthma Father   . COPD Father   . CAD Sister     SOCIAL HISTORY: Social History   Socioeconomic History  . Marital  status: Widowed    Spouse name: Not on file  . Number of children: 2  . Years of education: 35  . Highest education level: High school graduate  Occupational History  . Occupation: Retired  Scientific laboratory technician  . Financial resource strain: Not on file  . Food insecurity    Worry: Not on file    Inability: Not on file  . Transportation needs    Medical: Not on file    Non-medical: Not on file  Tobacco Use  . Smoking status: Former Smoker    Packs/day: 1.00    Years: 25.00    Pack years: 25.00    Types: Cigarettes    Start date: 12/17/1973    Quit date: 05/26/1998    Years since quitting: 20.8  . Smokeless tobacco: Never Used  Substance and Sexual Activity  . Alcohol use: No    Alcohol/week: 0.0 standard drinks  .  Drug use: No  . Sexual activity: Not on file  Lifestyle  . Physical activity    Days per week: Not on file    Minutes per session: Not on file  . Stress: Not on file  Relationships  . Social Herbalist on phone: Not on file    Gets together: Not on file    Attends religious service: Not on file    Active member of club or organization: Not on file    Attends meetings of clubs or organizations: Not on file    Relationship status: Not on file  . Intimate partner violence    Fear of current or ex partner: Not on file    Emotionally abused: Not on file    Physically abused: Not on file    Forced sexual activity: Not on file  Other Topics Concern  . Not on file  Social History Narrative   Lives alone.   Right-handed.   No daily caffeine use.     PHYSICAL EXAM   Vitals:   03/31/19 0857  BP: 127/72  Pulse: (!) 53  Temp: 97.6 F (36.4 C)  Weight: 117 lb (53.1 kg)  Height: 5\' 5"  (1.651 m)    Not recorded      Body mass index is 19.47 kg/m.  PHYSICAL EXAMNIATION:  Gen: NAD, conversant, well nourised, well groomed                     Cardiovascular: Regular rate rhythm, no peripheral edema, warm, nontender. Eyes: Conjunctivae clear without  exudates or hemorrhage Neck: Supple, no carotid bruits. Pulmonary: Clear to auscultation bilaterally   NEUROLOGICAL EXAM:  MENTAL STATUS: Speech/cognition: Awake, following command, severe dysarthria, ineligible   CRANIAL NERVES: CN II: Pupils are equal round reactive, normal visual field CN III, IV, VI: Right ptosis, bilateral ophthalmoplegia, especially with up and downward vertical gaze, mild limitation on bilateral horizontal eye movement CN V: Facial sensation is intact to pinprick in all 3 divisions bilaterally. Corneal responses are intact.  CN VII: Face is symmetric with normal eye closure and smile. CN VIII: Hearing is normal to causal conversation. CN IX, X: Palate elevates symmetrically. Phonation is normal. CN XI: Head turning and shoulder shrug are intact CN XII: Tongue is midline with normal movements and no atrophy.  MOTOR: Limited range of motion of right shoulder, right elbow, she complains of right shoulder elbow pain with passive stretch, tightness of biceps muscles, tendency for right shoulder abduction, arm pronation, right lateral 3 finger flexion at metaphalangeal joints,  Tendency for right ankle plantarflexion, significant bradykinesia, rigidity of bilateral upper and lower extremity, right worse than left, also has nuchal rigidity,  REFLEXES: Reflexes are 2+ and symmetric at the biceps, triceps, knees, and ankles. Plantar responses are extensor bilaterally  SENSORY: Intact to light touch, vibratory sensation,  COORDINATION: No dysmetria  GAIT/STANCE: She needs help to get up from seated position, holding her right arm in elbow position, shoulder abduction, wrist flexion, wide-based, small shuffling gait, dragging right leg more   DIAGNOSTIC DATA (LABS, IMAGING, TESTING) - I reviewed patient records, labs, notes, testing and imaging myself where available.   ASSESSMENT AND PLAN  CHELSIA SWALLOWS is a 78 y.o. 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 Ph.D.  [facility] [address] [place],  [address] Ph: [phone] Fax: [phone]  CC: Ashok Pall, MD, Neale Burly, MD

## 2019-04-04 DIAGNOSIS — I10 Essential (primary) hypertension: Secondary | ICD-10-CM | POA: Diagnosis not present

## 2019-04-04 DIAGNOSIS — R630 Anorexia: Secondary | ICD-10-CM | POA: Diagnosis not present

## 2019-04-04 DIAGNOSIS — R262 Difficulty in walking, not elsewhere classified: Secondary | ICD-10-CM | POA: Diagnosis not present

## 2019-04-04 DIAGNOSIS — F331 Major depressive disorder, recurrent, moderate: Secondary | ICD-10-CM | POA: Diagnosis not present

## 2019-04-04 DIAGNOSIS — I69220 Aphasia following other nontraumatic intracranial hemorrhage: Secondary | ICD-10-CM | POA: Diagnosis not present

## 2019-04-04 DIAGNOSIS — I69298 Other sequelae of other nontraumatic intracranial hemorrhage: Secondary | ICD-10-CM | POA: Diagnosis not present

## 2019-04-05 ENCOUNTER — Telehealth: Payer: Self-pay | Admitting: Cardiology

## 2019-04-05 NOTE — Telephone Encounter (Signed)
Virtual Visit Pre-Appointment Phone Call  "(Name), I am calling you today to discuss your upcoming appointment. We are currently trying to limit exposure to the virus that causes COVID-19 by seeing patients at home rather than in the office."  1. "What is the BEST phone number to call the day of the visit?" - include this in appointment notes  2. Do you have or have access to (through a family member/friend) a smartphone with video capability that we can use for your visit?" a. If yes - list this number in appt notes as cell (if different from BEST phone #) and list the appointment type as a VIDEO visit in appointment notes b. If no - list the appointment type as a PHONE visit in appointment notes  Confirm consent - "In the setting of the current Covid19 crisis, you are scheduled for a (phone or video) visit with your provider on (date) at (time).  Just as we do with many in-office visits, in order for you to participate in this visit, we must obtain consent.  If you'd like, I can send this to your mychart (if signed up) or email for you to review.  Otherwise, I can obtain your verbal consent now.  All virtual visits are billed to your insurance company just like a normal visit would be.  By agreeing to a virtual visit, we'd like you to understand that the technology does not allow for your provider to perform an examination, and thus may limit your provider's ability to fully assess your condition. If your provider identifies any concerns that need to be evaluated in person, we will make arrangements to do so.  Finally, though the technology is pretty good, we cannot assure that it will always work on either your or our end, and in the setting of a video visit, we may have to convert it to a phone-only visit.  In either situation, we cannot ensure that we have a secure connection.  Are you willing to proceed?" STAFF: Did the patient verbally acknowledge consent to telehealth visit? Document  YES/NO here: yes 3. Advise patient to be prepared - "Two hours prior to your appointment, go ahead and check your blood pressure, pulse, oxygen saturation, and your weight (if you have the equipment to check those) and write them all down. When your visit starts, your provider will ask you for this information. If you have an Apple Watch or Kardia device, please plan to have heart rate information ready on the day of your appointment. Please have a pen and paper handy nearby the day of the visit as well."  4. Give patient instructions for MyChart download to smartphone OR Doximity/Doxy.me as below if video visit (depending on what platform provider is using)  5. Inform patient they will receive a phone call 15 minutes prior to their appointment time (may be from unknown caller ID) so they should be prepared to answer    TELEPHONE CALL NOTE  Brandi [name] has been deemed a candidate for a follow-up tele-health visit to limit community exposure during the Covid-19 pandemic. I spoke with the patient via phone to ensure availability of phone/video source, confirm preferred email & phone number, and discuss instructions and expectations.  I reminded Brandi [name] to be prepared with any vital sign and/or heart rhythm information that could potentially be obtained via home monitoring, at the time of her visit. I reminded Brandi [name] to expect a phone call prior to her visit.  Brandi [name] 04/05/2019 12:11 PM   INSTRUCTIONS FOR DOWNLOADING THE MYCHART APP TO SMARTPHONE  - The patient must first make sure to have activated MyChart and know their login information - If Apple, go to [employer] and type in MyChart in the search bar and download the app. If Android, ask patient to go to [employer] and type in [place] in the search bar and download the app. The app is free but as with any other app downloads, their phone may require them to verify saved payment information or Apple/Android  password.  - The patient will need to then log into the app with their MyChart username and password, and select [facility] as their healthcare provider to link the account. When it is time for your visit, go to the MyChart app, find appointments, and click Begin Video Visit. Be sure to Select Allow for your device to access the Microphone and Camera for your visit. You will then be connected, and your provider will be with you shortly.  **If they have any issues connecting, or need assistance please contact MyChart service desk [phone])**  **If using a computer, in order to ensure the best quality for their visit they will need to use either of the following Internet Browsers: [employer], or Google Chrome**  IF USING DOXIMITY or DOXY.ME - The patient will receive a link just prior to their visit by text.     FULL LENGTH CONSENT FOR TELE-HEALTH VISIT   I hereby voluntarily request, consent and authorize [facility] and its employed or contracted physicians, physician assistants, nurse practitioners or other licensed health care professionals (the Practitioner), to provide me with telemedicine health care services (the Services") as deemed necessary by the treating Practitioner. I acknowledge and consent to receive the Services by the Practitioner via telemedicine. I understand that the telemedicine visit will involve communicating with the Practitioner through live audiovisual communication technology and the disclosure of certain medical information by electronic transmission. I acknowledge that I have been given the opportunity to request an in-person assessment or other available alternative prior to the telemedicine visit and am voluntarily participating in the telemedicine visit.  I understand that I have the right to withhold or withdraw my consent to the use of telemedicine in the course of my care at any time, without affecting my right to future care or treatment,  and that the Practitioner or I may terminate the telemedicine visit at any time. I understand that I have the right to inspect all information obtained and/or recorded in the course of the telemedicine visit and may receive copies of available information for a reasonable fee.  I understand that some of the potential risks of receiving the Services via telemedicine include:   Delay or interruption in medical evaluation due to technological equipment failure or disruption;  Information transmitted may not be sufficient (e.g. poor resolution of images) to allow for appropriate medical decision making by the Practitioner; and/or   In rare instances, security protocols could fail, causing a breach of personal health information.  Furthermore, I acknowledge that it is my responsibility to provide information about my medical history, conditions and care that is complete and accurate to the best of my ability. I acknowledge that Practitioner's advice, recommendations, and/or decision may be based on factors not within their control, such as incomplete or inaccurate data provided by me or distortions of diagnostic images or specimens that may result from electronic transmissions. I understand that the  practice of medicine is not an Chief Strategy Officer and that Practitioner makes no warranties or guarantees regarding treatment outcomes. I acknowledge that I will receive a copy of this consent concurrently upon execution via email to the email address I last provided but may also request a printed copy by calling the office of [facility].    I understand that my insurance will be billed for this visit.   I have read or had this consent read to me.  I understand the contents of this consent, which adequately explains the benefits and risks of the Services being provided via telemedicine.   I have been provided ample opportunity to ask questions regarding this consent and the Services and have had my questions  answered to my satisfaction.  I give my informed consent for the services to be provided through the use of telemedicine in my medical care  By participating in this telemedicine visit I agree to the above.

## 2019-04-06 DIAGNOSIS — I69298 Other sequelae of other nontraumatic intracranial hemorrhage: Secondary | ICD-10-CM | POA: Diagnosis not present

## 2019-04-06 DIAGNOSIS — I69220 Aphasia following other nontraumatic intracranial hemorrhage: Secondary | ICD-10-CM | POA: Diagnosis not present

## 2019-04-06 DIAGNOSIS — R262 Difficulty in walking, not elsewhere classified: Secondary | ICD-10-CM | POA: Diagnosis not present

## 2019-04-06 DIAGNOSIS — F331 Major depressive disorder, recurrent, moderate: Secondary | ICD-10-CM | POA: Diagnosis not present

## 2019-04-06 DIAGNOSIS — R630 Anorexia: Secondary | ICD-10-CM | POA: Diagnosis not present

## 2019-04-06 DIAGNOSIS — I10 Essential (primary) hypertension: Secondary | ICD-10-CM | POA: Diagnosis not present

## 2019-04-12 ENCOUNTER — Telehealth: Admitting: Cardiology

## 2019-04-26 DEATH — deceased

## 2019-05-23 ENCOUNTER — Ambulatory Visit: Payer: Medicare Other | Admitting: Neurology

## 2019-07-12 ENCOUNTER — Ambulatory Visit: Payer: Medicare Other | Admitting: Neurology

## 2020-04-24 IMAGING — DX DG CHEST 1V
1 series · 1 of 1 positions shown · non-contrast
Comparison: 02/28/2017

CLINICAL DATA: Right-sided weakness. Fall.

EXAM:
CHEST  1 VIEW

[x chest ap]
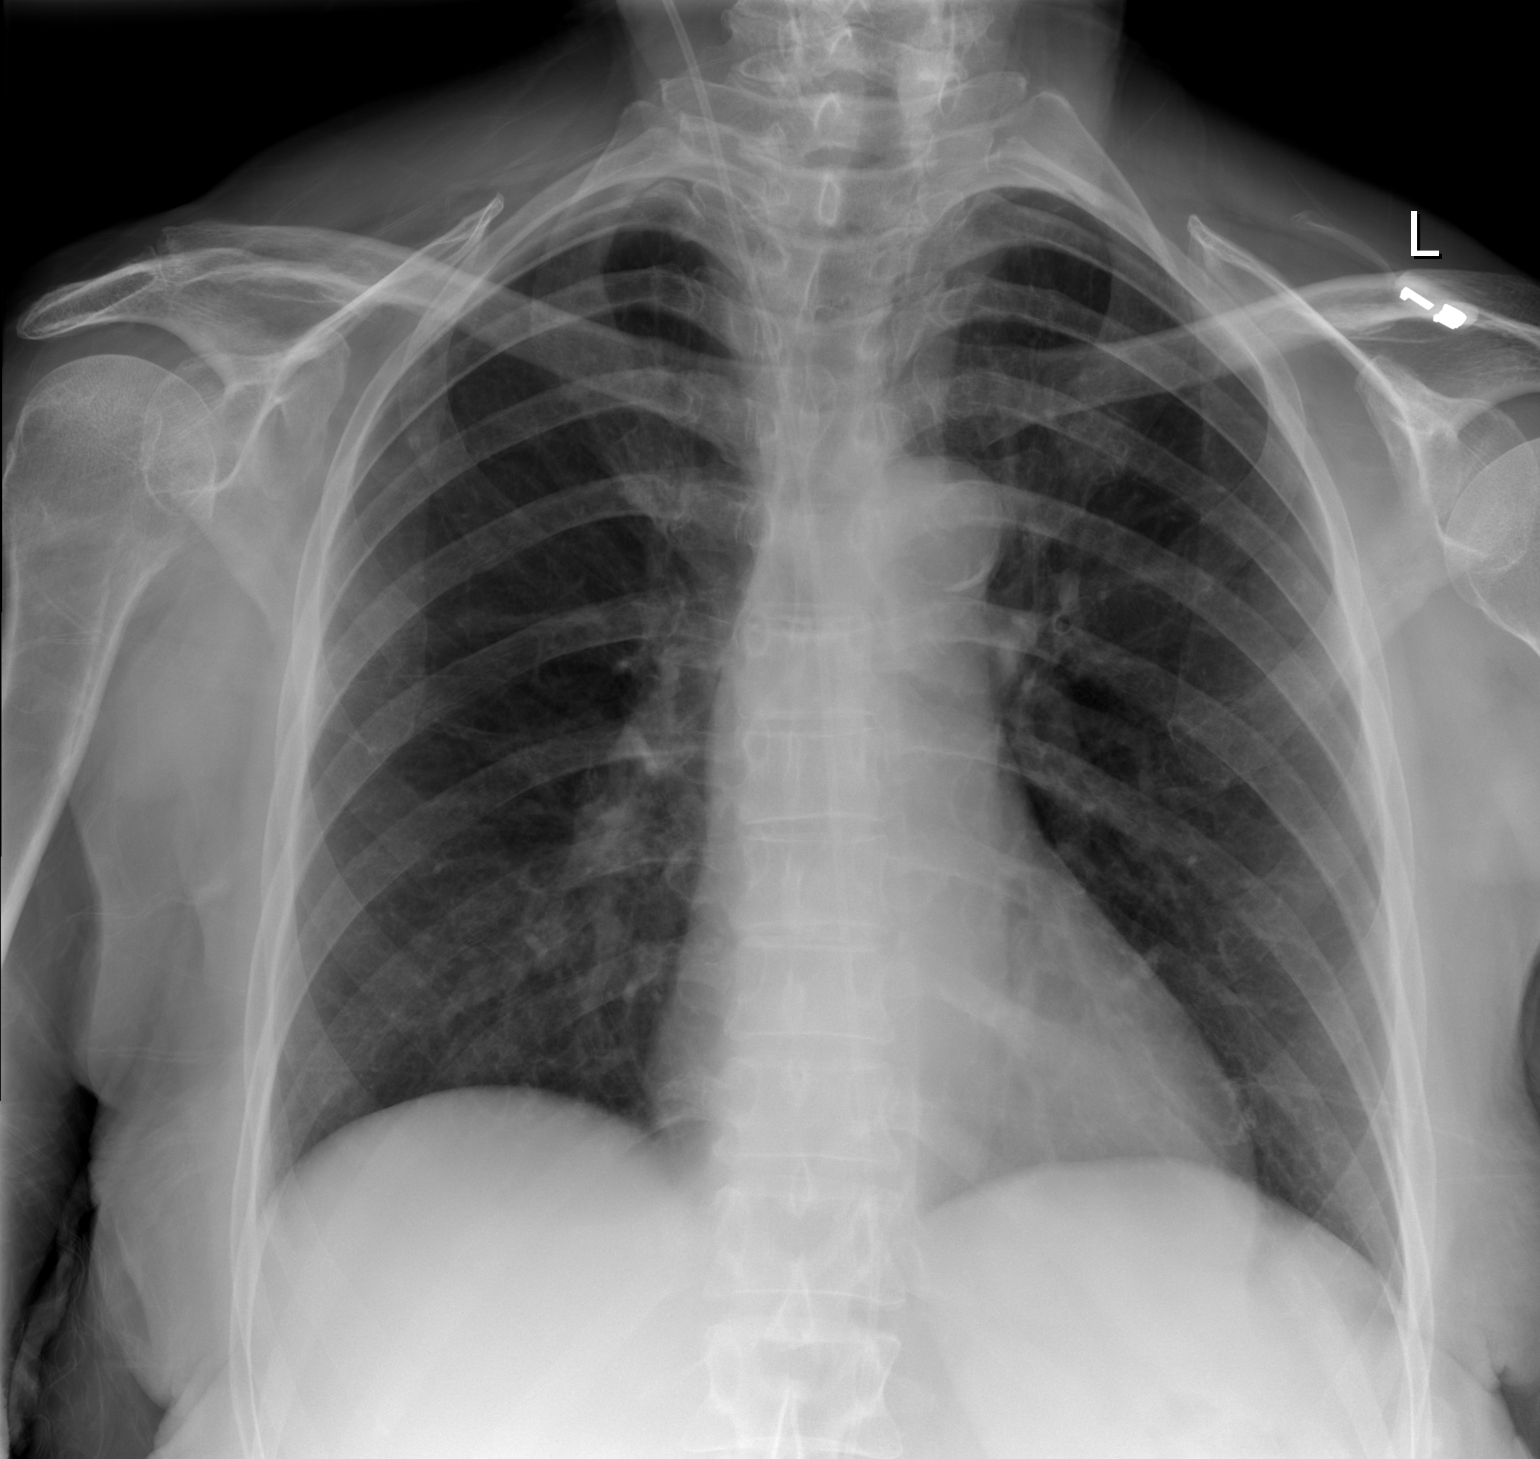

[1 of 1 positions shown; findings below may reference images not displayed]

FINDINGS: Patient rotated minimally left. Normal heart size. Atherosclerosis
in the transverse aorta. No pleural effusion or pneumothorax.
Right-sided VP shunt catheter. EKG lead artifact. Clear lungs.
IMPRESSION: 1. No acute cardiopulmonary disease.
2. Aortic atherosclerosis.
